# Patient Record
Sex: Male | Born: 1950 | Race: White | Hispanic: No | Marital: Single | Smoking: Never smoker
Health system: Southern US, Community
[De-identification: ages and names within clinical notes are randomized; demographics above are authoritative.]

## PROBLEM LIST (undated history)

## (undated) DIAGNOSIS — G4733 Obstructive sleep apnea (adult) (pediatric): Secondary | ICD-10-CM

## (undated) DIAGNOSIS — I10 Essential (primary) hypertension: Secondary | ICD-10-CM

## (undated) DIAGNOSIS — E119 Type 2 diabetes mellitus without complications: Secondary | ICD-10-CM

## (undated) DIAGNOSIS — R011 Cardiac murmur, unspecified: Secondary | ICD-10-CM

## (undated) DIAGNOSIS — M1990 Unspecified osteoarthritis, unspecified site: Secondary | ICD-10-CM

## (undated) DIAGNOSIS — G4730 Sleep apnea, unspecified: Secondary | ICD-10-CM

## (undated) DIAGNOSIS — E785 Hyperlipidemia, unspecified: Secondary | ICD-10-CM

## (undated) DIAGNOSIS — K219 Gastro-esophageal reflux disease without esophagitis: Secondary | ICD-10-CM

## (undated) HISTORY — DX: Sleep apnea, unspecified: G47.30

## (undated) HISTORY — DX: Cardiac murmur, unspecified: R01.1

## (undated) HISTORY — DX: Unspecified osteoarthritis, unspecified site: M19.90

## (undated) HISTORY — PX: NECK SURGERY: SHX720

## (undated) HISTORY — DX: Obstructive sleep apnea (adult) (pediatric): G47.33

## (undated) HISTORY — DX: Hyperlipidemia, unspecified: E78.5

## (undated) HISTORY — DX: Essential (primary) hypertension: I10

## (undated) HISTORY — DX: Gastro-esophageal reflux disease without esophagitis: K21.9

## (undated) HISTORY — DX: Type 2 diabetes mellitus without complications: E11.9

---

## 2016-09-11 DIAGNOSIS — M170 Bilateral primary osteoarthritis of knee: Secondary | ICD-10-CM | POA: Insufficient documentation

## 2019-07-28 ENCOUNTER — Encounter: Payer: Self-pay | Admitting: Family Medicine

## 2019-11-07 ENCOUNTER — Encounter: Payer: Self-pay | Admitting: Family Medicine

## 2020-01-30 NOTE — Progress Notes (Signed)
Danny Jaksch, DO Reason for referral-abnormal electrocardiogram  HPI: 69 year old male for evaluation of abnormal electrocardiogram at request of Doreatha Lew, DO.  Laboratories April 2021 showed BUN 16, creatinine 0.97, sodium 141, potassium 4.4, normal liver functions, hemoglobin 14.7, hemoglobin A1c 9.3, LDL 93.  Patient describes increased fatigue for approximately 2 years.  He has dyspnea with more vigorous activities but not routine activities.  He denies orthopnea, PND or pedal edema.  He denies chest pain.  Cardiology now asked to evaluate.  Current Outpatient Medications  Medication Sig Dispense Refill  . atorvastatin (LIPITOR) 80 MG tablet Take 80 mg by mouth daily.    Marland Kitchen buPROPion (WELLBUTRIN SR) 100 MG 12 hr tablet Take 100 mg by mouth 2 (two) times daily.    . chlorthalidone (HYGROTON) 25 MG tablet Take 50 mg by mouth daily.    . fenofibrate 160 MG tablet     . glipiZIDE (GLUCOTROL XL) 10 MG 24 hr tablet Take 10 mg by mouth 2 (two) times daily.    Marland Kitchen lisinopril (ZESTRIL) 40 MG tablet Take 40 mg by mouth daily.    . meloxicam (MOBIC) 15 MG tablet Take 15 mg by mouth daily.    . metFORMIN (GLUCOPHAGE) 1000 MG tablet Take 1,000 mg by mouth 2 (two) times daily.    . prazosin (MINIPRESS) 1 MG capsule Take 1 mg by mouth 2 (two) times daily.    Nelva Nay SOLOSTAR 300 UNIT/ML Solostar Pen     . traMADol (ULTRAM) 50 MG tablet Take 50 mg by mouth 2 (two) times daily as needed.     No current facility-administered medications for this visit.    Allergies  Allergen Reactions  . Penicillins     When he was a child     Past Medical History:  Diagnosis Date  . Diabetes mellitus ([place])   . Gastroesophageal reflux disease   . Hyperlipidemia   . Hypertension   . Obstructive sleep apnea     Past Surgical History:  Procedure Laterality Date  . NECK SURGERY      Social History   Socioeconomic History  . Marital status: Single    Spouse name: Not on file  . Number  of children: Not on file  . Years of education: Not on file  . Highest education level: Not on file  Occupational History  . Not on file  Tobacco Use  . Smoking status: Never Smoker  . Smokeless tobacco: Never Used  Substance and Sexual Activity  . Alcohol use: Yes  . Drug use: Not on file  . Sexual activity: Not on file  Other Topics Concern  . Not on file  Social History Narrative  . Not on file   Social Determinants of Health   Financial Resource Strain:   . Difficulty of Paying Living Expenses:   Food Insecurity:   . Worried About Charity fundraiser in the Last Year:   . Arboriculturist in the Last Year:   Transportation Needs:   . Film/video editor (Medical):   Marland Kitchen Lack of Transportation (Non-Medical):   Physical Activity:   . Days of Exercise per Week:   . Minutes of Exercise per Session:   Stress:   . Feeling of Stress :   Social Connections:   . Frequency of Communication with Friends and Family:   . Frequency of Social Gatherings with Friends and Family:   . Attends Religious Services:   . Active Member of Clubs or  Organizations:   . Attends Banker Meetings:   Marland Kitchen Marital Status:   Intimate Partner Violence:   . Fear of Current or Ex-Partner:   . Emotionally Abused:   Marland Kitchen Physically Abused:   . Sexually Abused:     Family History  Problem Relation Age of Onset  . Coronary artery disease Mother   . Coronary artery disease Sister     ROS: Knee arthralgias and fatigue but no fevers or chills, productive cough, hemoptysis, dysphasia, odynophagia, melena, hematochezia, dysuria, hematuria, rash, seizure activity, orthopnea, PND, pedal edema, claudication. Remaining systems are negative.  Physical Exam:   Blood pressure 130/82, pulse 79, height 6\' 1"  (1.854 m), weight 296 lb 1.9 oz (134.3 kg), SpO2 97 %.  General:  Well developed/well nourished in NAD Skin warm/dry Patient not depressed No peripheral  clubbing Back-normal HEENT-normal/normal eyelids Neck supple/normal carotid upstroke bilaterally; no bruits; no JVD; no thyromegaly chest - CTA/ normal expansion CV - RRR/normal S1 and S2; no rubs or gallops;  PMI nondisplaced; 2/6 systolic murmur left sternal border. Abdomen -NT/ND, no HSM, no mass, + bowel sounds, no bruit 2+ femoral pulses, no bruits Ext-no edema, chords, 2+ DP Neuro-grossly nonfocal  ECG -January 18, 2020-sinus rhythm, left axis deviation, right bundle branch block, cannot rule out inferior infarct.  Personally reviewed  A/P  1 abnormal electrocardiogram-electrocardiogram shows possible prior inferior infarct.  Patient also with multiple risk factors including diabetes mellitus.  He does have dyspnea on exertion.  I will arrange a cardiac CTA to exclude significant coronary disease.  We will also arrange an echocardiogram to assess wall motion and LV function.  2 murmur-probable aortic sclerosis.  Echocardiogram to further assess.  3 hypertension-blood pressure controlled.  Continue present medications.  4 hyperlipidemia-continue statin.  5 diabetes mellitus-Per primary care.  January 20, 2020, MD

## 2020-02-01 ENCOUNTER — Other Ambulatory Visit: Payer: Self-pay

## 2020-02-01 ENCOUNTER — Encounter: Payer: Self-pay | Admitting: Cardiology

## 2020-02-01 ENCOUNTER — Ambulatory Visit (INDEPENDENT_AMBULATORY_CARE_PROVIDER_SITE_OTHER): Payer: Medicare Other | Admitting: Cardiology

## 2020-02-01 VITALS — BP 130/82 | HR 79 | Ht 73.0 in | Wt 296.1 lb

## 2020-02-01 DIAGNOSIS — R0600 Dyspnea, unspecified: Secondary | ICD-10-CM | POA: Diagnosis not present

## 2020-02-01 DIAGNOSIS — R9431 Abnormal electrocardiogram [ECG] [EKG]: Secondary | ICD-10-CM

## 2020-02-01 DIAGNOSIS — I10 Essential (primary) hypertension: Secondary | ICD-10-CM | POA: Diagnosis not present

## 2020-02-01 DIAGNOSIS — R0609 Other forms of dyspnea: Secondary | ICD-10-CM

## 2020-02-01 DIAGNOSIS — E7800 Pure hypercholesterolemia, unspecified: Secondary | ICD-10-CM | POA: Diagnosis not present

## 2020-02-01 MED ORDER — METOPROLOL TARTRATE 100 MG PO TABS
ORAL_TABLET | ORAL | 0 refills | Status: DC
Start: 2020-02-01 — End: 2020-04-18

## 2020-02-01 NOTE — Patient Instructions (Signed)
Medication Instructions:  NO CHANGE *If you need a refill on your cardiac medications before your next appointment, please call your pharmacy*   Lab Work: If you have labs (blood work) drawn today and your tests are completely normal, you will receive your results only by: Marland Kitchen MyChart Message (if you have MyChart) OR . A paper copy in the mail If you have any lab test that is abnormal or we need to change your treatment, we will call you to review the results.   Testing/Procedures: Your physician has requested that you have an echocardiogram. Echocardiography is a painless test that uses sound waves to create images of your heart. It provides your doctor with information about the size and shape of your heart and how well your heart's chambers and valves are working. This procedure takes approximately one hour. There are no restrictions for this procedure.[place] [facility]-1ST FLOOR IMAGING  Your cardiac CT will be scheduled at one of the below locations:   [facility] [address] [place], [place] [address] (336) [place] [address] [place], [place] [address] [phone]  If scheduled at [facility], please arrive at the [facility] main entrance of [facility] 30 minutes prior to test start time. Proceed to the [facility] Radiology Department (first floor) to check-in and test prep.  If scheduled at [facility], please arrive 15 mins early for check-in and test prep.  Please follow these instructions carefully (unless otherwise directed):  Hold all erectile dysfunction medications at least 3 days (72 hrs) prior to test.  On the Night Before the Test: . Be sure to Drink plenty of water. . Do not consume any caffeinated/decaffeinated beverages or chocolate 12 hours prior to your test. . Do not take any antihistamines 12 hours prior to your test. . If you  take Metformin do not take 24 hours prior to test.  On the Day of the Test: . Drink plenty of water. Do not drink any water within one hour of the test. . Do not eat any food 4 hours prior to the test. . You may take your regular medications prior to the test.  . Take metoprolol (Lopressor) 100 MG two hours prior to test. . HOLD Furosemide/Hydrochlorothiazide morning of the test. . FEMALES- please wear underwire-free bra if available       After the Test: . Drink plenty of water. . After receiving IV contrast, you may experience a mild flushed feeling. This is normal. . On occasion, you may experience a mild rash up to 24 hours after the test. This is not dangerous. If this occurs, you can take Benadryl 25 mg and increase your fluid intake. . If you experience trouble breathing, this can be serious. If it is severe call 911 IMMEDIATELY. If it is mild, please call our office. . If you take any of these medications: Glipizide/Metformin, Avandament, Glucavance, please do not take 48 hours after completing test unless otherwise instructed.   Once we have confirmed authorization from your insurance company, we will call you to set up a date and time for your test.   For non-scheduling related questions, please contact the cardiac imaging nurse navigator should you have any questions/concerns: Marchia Bond, RN Navigator Cardiac Imaging [facility] [phone] office  For scheduling needs, including cancellations and rescheduling, please call [phone].        Follow-Up: At [facility],  you and your health needs are our priority.  As part of our continuing mission to provide you with exceptional heart care, we have created designated Provider Care Teams.  These Care Teams include your primary Cardiologist (physician) and Advanced Practice Providers (APPs -  Physician Assistants and Nurse Practitioners) who all work together to provide you with the care you  need, when you need it.  We recommend signing up for the patient portal called "MyChart".  Sign up information is provided on this After Visit Summary.  MyChart is used to connect with patients for Virtual Visits (Telemedicine).  Patients are able to view lab/test results, encounter notes, upcoming appointments, etc.  Non-urgent messages can be sent to your provider as well.   To learn more about what you can do with MyChart, go to [email or web address].    Your next appointment:   3 month(s)  The format for your next appointment:   In Person  Provider:   Olga Millers, MD

## 2020-02-07 ENCOUNTER — Other Ambulatory Visit: Payer: Self-pay

## 2020-02-07 ENCOUNTER — Ambulatory Visit (HOSPITAL_BASED_OUTPATIENT_CLINIC_OR_DEPARTMENT_OTHER)
Admission: RE | Admit: 2020-02-07 | Discharge: 2020-02-07 | Disposition: A | Discharge status: Home / Self Care | Payer: Medicare Other | Source: Ambulatory Visit | Attending: Cardiology | Admitting: Cardiology

## 2020-02-07 DIAGNOSIS — R0600 Dyspnea, unspecified: Secondary | ICD-10-CM | POA: Diagnosis present

## 2020-02-07 DIAGNOSIS — R9431 Abnormal electrocardiogram [ECG] [EKG]: Secondary | ICD-10-CM | POA: Diagnosis present

## 2020-02-07 DIAGNOSIS — R0609 Other forms of dyspnea: Secondary | ICD-10-CM

## 2020-02-07 NOTE — Progress Notes (Signed)
  Echocardiogram 2D Echocardiogram has been performed.  Prentiss Bells Mechille Varghese 02/07/2020, 10:40 AM

## 2020-02-17 ENCOUNTER — Other Ambulatory Visit: Payer: Self-pay | Admitting: *Deleted

## 2020-02-17 DIAGNOSIS — R0609 Other forms of dyspnea: Secondary | ICD-10-CM

## 2020-02-20 ENCOUNTER — Telehealth (HOSPITAL_COMMUNITY): Payer: Self-pay | Admitting: *Deleted

## 2020-02-20 NOTE — Telephone Encounter (Signed)
Attempted to call patient regarding upcoming cardiac CT appointment. Left message on voicemail with name and callback number  Merle Tai RN Navigator Cardiac Imaging [place] [facility] [phone] Office [phone] Cell  

## 2020-02-20 NOTE — Telephone Encounter (Signed)
Pt returning call concerning CT scan.  Instructions regarding cardiac CT review and pt expressed understanding of instructions.

## 2020-02-21 ENCOUNTER — Ambulatory Visit (HOSPITAL_COMMUNITY): Admission: RE | Admit: 2020-02-21 | Payer: Medicare Other | Source: Ambulatory Visit

## 2020-02-21 ENCOUNTER — Telehealth: Payer: Self-pay | Admitting: *Deleted

## 2020-02-21 DIAGNOSIS — N289 Disorder of kidney and ureter, unspecified: Secondary | ICD-10-CM

## 2020-02-21 LAB — BASIC METABOLIC PANEL
BUN/Creatinine Ratio: 26 — ABNORMAL HIGH (ref 10–24)
BUN: 53 mg/dL — ABNORMAL HIGH (ref 8–27)
CO2: 16 mmol/L — ABNORMAL LOW (ref 20–29)
Calcium: 10.6 mg/dL — ABNORMAL HIGH (ref 8.6–10.2)
Chloride: 107 mmol/L — ABNORMAL HIGH (ref 96–106)
Creatinine, Ser: 2.05 mg/dL — ABNORMAL HIGH (ref 0.76–1.27)
GFR calc Af Amer: 38 mL/min/{1.73_m2} — ABNORMAL LOW (ref >59)
GFR calc non Af Amer: 33 mL/min/{1.73_m2} — ABNORMAL LOW (ref >59)
Glucose: 156 mg/dL — ABNORMAL HIGH (ref 65–99)
Potassium: 4.9 mmol/L (ref 3.5–5.2)
Sodium: 142 mmol/L (ref 134–144)

## 2020-02-21 NOTE — Telephone Encounter (Signed)
Spoke with pt, labs for CTA reviewed by dr Jens Som, CTA cancelled for today. Patient told to hol chlorthalidone and lisinopril. He will have repeat lab work drawn on Friday this week and based on those results we will make adjustments on medications and get the CTA rescheduled. Danny Montoya aware to cancel CTA for today.

## 2020-02-25 LAB — BASIC METABOLIC PANEL
BUN/Creatinine Ratio: 22 (ref 10–24)
BUN: 29 mg/dL — ABNORMAL HIGH (ref 8–27)
CO2: 20 mmol/L (ref 20–29)
Calcium: 10.7 mg/dL — ABNORMAL HIGH (ref 8.6–10.2)
Chloride: 104 mmol/L (ref 96–106)
Creatinine, Ser: 1.3 mg/dL — ABNORMAL HIGH (ref 0.76–1.27)
GFR calc Af Amer: 66 mL/min/{1.73_m2} (ref >59)
GFR calc non Af Amer: 57 mL/min/{1.73_m2} — ABNORMAL LOW (ref >59)
Glucose: 198 mg/dL — ABNORMAL HIGH (ref 65–99)
Potassium: 4.6 mmol/L (ref 3.5–5.2)
Sodium: 138 mmol/L (ref 134–144)

## 2020-02-27 ENCOUNTER — Telehealth: Payer: Self-pay | Admitting: *Deleted

## 2020-02-27 DIAGNOSIS — N289 Disorder of kidney and ureter, unspecified: Secondary | ICD-10-CM

## 2020-02-27 NOTE — Telephone Encounter (Signed)
Spoke with pt, Aware of dr crenshaw's recommendations.  Lab orders mailed to the pt  

## 2020-02-27 NOTE — Telephone Encounter (Addendum)
-----   Message from Lewayne Bunting, MD sent at 02/25/2020  7:22 AM EDT ----- Renal function improving; stay off chlorthalidone and resume lisinopril at previous dose; bmet one week later. Olga Millers  Left message for pt to call

## 2020-03-13 ENCOUNTER — Encounter: Payer: Self-pay | Admitting: *Deleted

## 2020-03-13 LAB — BASIC METABOLIC PANEL
BUN/Creatinine Ratio: 23 (ref 10–24)
BUN: 24 mg/dL (ref 8–27)
CO2: 19 mmol/L — ABNORMAL LOW (ref 20–29)
Calcium: 9.9 mg/dL (ref 8.6–10.2)
Chloride: 108 mmol/L — ABNORMAL HIGH (ref 96–106)
Creatinine, Ser: 1.05 mg/dL (ref 0.76–1.27)
GFR calc Af Amer: 85 mL/min/{1.73_m2} (ref >59)
GFR calc non Af Amer: 74 mL/min/{1.73_m2} (ref >59)
Glucose: 163 mg/dL — ABNORMAL HIGH (ref 65–99)
Potassium: 4.5 mmol/L (ref 3.5–5.2)
Sodium: 143 mmol/L (ref 134–144)

## 2020-04-12 NOTE — Progress Notes (Signed)
HPI: FU abnormal ECG.  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  Cardiac CTA ordered at last office visit but not performed as BUN/Cr 53/2.05.  His chlorthalidone was discontinued and BUN improved to 24 and creatinine 1.05.  Since last seen he has dyspnea on exertion but no orthopnea, PND, pedal edema, chest pain or syncope.  Current Outpatient Medications  Medication Sig Dispense Refill   atorvastatin (LIPITOR) 80 MG tablet Take 80 mg by mouth daily.     buPROPion (WELLBUTRIN SR) 100 MG 12 hr tablet Take 100 mg by mouth 2 (two) times daily.     fenofibrate 160 MG tablet      glipiZIDE (GLUCOTROL XL) 10 MG 24 hr tablet Take 10 mg by mouth 2 (two) times daily.     lisinopril (ZESTRIL) 40 MG tablet Take 40 mg by mouth daily.     meloxicam (MOBIC) 15 MG tablet Take 15 mg by mouth daily.     metFORMIN (GLUCOPHAGE) 1000 MG tablet Take 1,000 mg by mouth 2 (two) times daily.     prazosin (MINIPRESS) 1 MG capsule Take 1 mg by mouth 2 (two) times daily.     TOUJEO SOLOSTAR 300 UNIT/ML Solostar Pen      traMADol (ULTRAM) 50 MG tablet Take 50 mg by mouth 2 (two) times daily as needed.     metoprolol tartrate (LOPRESSOR) 100 MG tablet TAKE 2 HOURS PRIOR TO CT SCAN (Patient not taking: Reported on 04/18/2020) 1 tablet 0   No current facility-administered medications for this visit.     Past Medical History:  Diagnosis Date   Diabetes mellitus ([facility])    Gastroesophageal reflux disease    Hyperlipidemia    Hypertension    Obstructive sleep apnea     Past Surgical History:  Procedure Laterality Date   NECK SURGERY      Social History   Socioeconomic History   Marital status: Single    Spouse name: Not on file   Number of children: Not on file   Years of education: Not on file   Highest  education level: Not on file  Occupational History   Not on file  Tobacco Use   Smoking status: Never Smoker   Smokeless tobacco: Never Used  Substance and Sexual Activity   Alcohol use: Yes   Drug use: Not on file   Sexual activity: Not on file  Other Topics Concern   Not on file  Social History Narrative   Not on file   Social Determinants of Health   Financial Resource Strain:    Difficulty of Paying Living Expenses:   Food Insecurity:    Worried About Running Out of Food in the Last Year:    Barista in the Last Year:   Transportation Needs:    Freight forwarder (Medical):    Lack of Transportation (Non-Medical):   Physical Activity:    Days of Exercise per Week:    Minutes of Exercise per Session:   Stress:    Feeling of Stress :   Social Connections:    Frequency of Communication with Friends and Family:    Frequency of Social Gatherings with Friends and Family:    Attends Religious Services:    Active Member of Clubs or Organizations:    Attends Banker Meetings:  Marital Status:   Intimate Partner Violence:    Fear of Current or Ex-Partner:    Emotionally Abused:    Physically Abused:    Sexually Abused:     Family History  Problem Relation Age of Onset   Coronary artery disease Mother    Coronary artery disease Sister     ROS: Knee arthralgias but no fevers or chills, productive cough, hemoptysis, dysphasia, odynophagia, melena, hematochezia, dysuria, hematuria, rash, seizure activity, orthopnea, PND, pedal edema, claudication. Remaining systems are negative.  Physical Exam: Well-developed obese in no acute distress.  Skin is warm and dry.  HEENT is normal.  Neck is supple.  Chest is clear to auscultation with normal expansion.  Cardiovascular exam is regular rate and rhythm.  Abdominal exam nontender or distended. No masses palpated. Extremities show no edema. neuro grossly intact  A/P  1  abnormal electrocardiogram-previous ECG shows possible prior inferior infarct.  However echocardiogram shows normal LV function with no wall motion abnormalities.  Patient also has dyspnea on exertion, greater than 20 years of diabetes mellitus and also will require knee replacement in the future.  We will reschedule cardiac CTA to further assess. Hold glucophage 48 hours following CTA.  2 hypertension-blood pressure controlled.  Continue present medications.  3 hyperlipidemia-continue statin.  4 diabetes mellitus-follow-up primary care.  5 dilated ascending aorta-this will be reassessed with above CTA.  Olga Millers, MD

## 2020-04-18 ENCOUNTER — Encounter: Payer: Self-pay | Admitting: Cardiology

## 2020-04-18 ENCOUNTER — Other Ambulatory Visit: Payer: Self-pay

## 2020-04-18 ENCOUNTER — Ambulatory Visit: Payer: Medicare Other | Admitting: Cardiology

## 2020-04-18 VITALS — BP 156/82 | HR 93 | Ht 73.0 in | Wt 300.0 lb

## 2020-04-18 DIAGNOSIS — R9431 Abnormal electrocardiogram [ECG] [EKG]: Secondary | ICD-10-CM

## 2020-04-18 DIAGNOSIS — R0609 Other forms of dyspnea: Secondary | ICD-10-CM

## 2020-04-18 DIAGNOSIS — I10 Essential (primary) hypertension: Secondary | ICD-10-CM

## 2020-04-18 DIAGNOSIS — R0600 Dyspnea, unspecified: Secondary | ICD-10-CM

## 2020-04-18 DIAGNOSIS — E7800 Pure hypercholesterolemia, unspecified: Secondary | ICD-10-CM | POA: Diagnosis not present

## 2020-04-18 MED ORDER — METOPROLOL TARTRATE 100 MG PO TABS
ORAL_TABLET | ORAL | 0 refills | Status: DC
Start: 2020-04-18 — End: 2021-12-16

## 2020-04-18 NOTE — Patient Instructions (Signed)
Medication Instructions:  NO CHANGE *If you need a refill on your cardiac medications before your next appointment, please call your pharmacy*   Lab Work: Your physician recommends that you return for lab work 2-3 DAYS PRIOR TO CT SCAN  If you have labs (blood work) drawn today and your tests are completely normal, you will receive your results only by: Marland Kitchen MyChart Message (if you have MyChart) OR . A paper copy in the mail If you have any lab test that is abnormal or we need to change your treatment, we will call you to review the results.   Testing/Procedures: Your cardiac CT will be scheduled at one of the below locations:   [facility] [address] [place], [place] [address] [phone]  If scheduled at [facility], please arrive at the [facility] main entrance of [facility] 30 minutes prior to test start time. Proceed to the [facility] Radiology Department (first floor) to check-in and test prep.  Please follow these instructions carefully (unless otherwise directed):  Hold all erectile dysfunction medications at least 3 days (72 hrs) prior to test.  On the Night Before the Test: . Be sure to Drink plenty of water. . Do not consume any caffeinated/decaffeinated beverages or chocolate 12 hours prior to your test. . Do not take any antihistamines 12 hours prior to your test.  On the Day of the Test: . Drink plenty of water. Do not drink any water within one hour of the test. . Do not eat any food 4 hours prior to the test. . You may take your regular medications prior to the test.  . Take metoprolol (Lopressor) 100 MG two hours prior to test. . HOLD Furosemide/Hydrochlorothiazide morning of the test.      After the Test: . Drink plenty of water. . After receiving IV contrast, you may experience a mild flushed feeling. This is normal. . On occasion, you may experience a mild rash up to 24 hours after the test. This is not dangerous. If  this occurs, you can take Benadryl 25 mg and increase your fluid intake. . If you experience trouble breathing, this can be serious. If it is severe call 911 IMMEDIATELY. If it is mild, please call our office. . If you take any of these medications: Glipizide/Metformin, Avandament, Glucavance, please do not take 48 hours after completing test unless otherwise instructed.   Once we have confirmed authorization from your insurance company, we will call you to set up a date and time for your test. Based on how quickly your insurance processes prior authorizations requests, please allow up to 4 weeks to be contacted for scheduling your Cardiac CT appointment. Be advised that routine Cardiac CT appointments could be scheduled as many as 8 weeks after your provider has ordered it.  For non-scheduling related questions, please contact the cardiac imaging nurse navigator should you have any questions/concerns: Marchia Bond, Cardiac Imaging Nurse Navigator Burley Saver, Interim Cardiac Imaging Nurse [place] [facility] Direct Office Dial: [phone]   For scheduling needs, including cancellations and rescheduling, please call Vivien Rota at [phone], option 3.    Follow-Up: At [facility], you and your health needs are our priority.  As part of our continuing mission to provide you with exceptional heart care, we have created designated Provider Care Teams.  These Care Teams include your primary Cardiologist (physician) and Advanced Practice Providers (APPs -  Physician Assistants and Nurse Practitioners) who all work  together to provide you with the care you need, when you need it.  We recommend signing up for the patient portal called "MyChart".  Sign up information is provided on this After Visit Summary.  MyChart is used to connect with patients for Virtual Visits (Telemedicine).  Patients are able to view lab/test results, encounter notes, upcoming appointments, etc.   Non-urgent messages can be sent to your provider as well.   To learn more about what you can do with MyChart, go to [email or web address].    Your next appointment:   6 month(s)  The format for your next appointment:   In Person  Provider:   Kirk Ruths, MD

## 2020-06-14 DIAGNOSIS — R944 Abnormal results of kidney function studies: Secondary | ICD-10-CM | POA: Insufficient documentation

## 2020-06-21 ENCOUNTER — Telehealth: Payer: Self-pay | Admitting: Cardiology

## 2020-06-21 NOTE — Telephone Encounter (Signed)
Pt called requesting to have cardiac CTA performed at [facility]. Nurse informed pt that from understanding, test has to be performed at [facility] due to Dr. Jens Som being the ordering provider. Pt voiced frustrated and state it has been forever since MD ordered test and still hasn't been schedule. Pt state at this point he would prefer to have it done at [facility] and requesting changing of orders.

## 2020-06-21 NOTE — Telephone Encounter (Signed)
Makel is calling requesting Dr. Jens Som send over his request for him to have a CT to [facility] so he can have it performed there instead. The phone number to the office is [phone] and the fax # is [phone].

## 2020-06-25 NOTE — Telephone Encounter (Signed)
According to pre-cert there is no authorization needed for cardiac CTA. Required paperwork faxed to the number provided.

## 2020-06-25 NOTE — Telephone Encounter (Signed)
Left message for pt to call. An order with demographics and prior authorization for the cardiac CTA can be faxed to [facility]ology team at [phone]. Waiting for the authorization to be able to fax requested info. Message sent to pre-cert to get authorization for testing.

## 2020-06-27 NOTE — Telephone Encounter (Signed)
Spoke with pt, aware paperwork for scheduling cardiac CTA faxed to [facility].

## 2020-06-29 NOTE — Telephone Encounter (Signed)
Pt called and said [facility] did not receive any information from our office for the patient's upcoming CT. The patient does not have the office fax number any longer, all he has is the phone number [phone]). After contacting the office the fax number is [phone]

## 2020-06-29 NOTE — Telephone Encounter (Signed)
Message routed to Debra RN 

## 2020-06-29 NOTE — Telephone Encounter (Signed)
Spoke with pt, aware paperwork has been received by the [facility] radiology because they have faxed me requesting additional information. Aware requested info faxed to [phone].

## 2020-07-20 ENCOUNTER — Telehealth: Payer: Self-pay | Admitting: *Deleted

## 2020-07-20 MED ORDER — ASPIRIN EC 81 MG PO TBEC: 81.0000 mg | DELAYED_RELEASE_TABLET | Freq: Every day | ORAL | 3 refills | Status: AC

## 2020-07-20 NOTE — Telephone Encounter (Signed)
Left message for pt to call, cardiac CTA done at [facility] results reviewed by dr Jens Som. Patient ca score is 335.6, mild nonobstructive coronary artery disease.dr Jens Som wants the patient to continue on his statin and add aspirin 81 mg once daily. 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

## 2020-07-20 NOTE — Telephone Encounter (Signed)
Spoke with pt, Aware of dr crenshaw's recommendations.  °

## 2020-07-24 DIAGNOSIS — H903 Sensorineural hearing loss, bilateral: Secondary | ICD-10-CM | POA: Insufficient documentation

## 2021-01-22 NOTE — Progress Notes (Deleted)
HPI: FU abnormal ECG.  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  Cardiac CTA ordered at previous office visit but not performed as BUN/Cr 53/2.05.  His chlorthalidone was discontinued and BUN improved to 24 and creatinine 1.05.  Since last seen   Current Outpatient Medications  Medication Sig Dispense Refill  . aspirin EC 81 MG tablet Take 1 tablet (81 mg total) by mouth daily. Swallow whole. 90 tablet 3  . atorvastatin (LIPITOR) 80 MG tablet Take 80 mg by mouth daily.    Marland Kitchen buPROPion (WELLBUTRIN SR) 100 MG 12 hr tablet Take 100 mg by mouth 2 (two) times daily.    . fenofibrate 160 MG tablet     . glipiZIDE (GLUCOTROL XL) 10 MG 24 hr tablet Take 10 mg by mouth 2 (two) times daily.    Marland Kitchen lisinopril (ZESTRIL) 40 MG tablet Take 40 mg by mouth daily.    . meloxicam (MOBIC) 15 MG tablet Take 15 mg by mouth daily.    . metFORMIN (GLUCOPHAGE) 1000 MG tablet Take 1,000 mg by mouth 2 (two) times daily.    . metoprolol tartrate (LOPRESSOR) 100 MG tablet TAKE 2 HOURS PRIOR TO CT SCAN 1 tablet 0  . prazosin (MINIPRESS) 1 MG capsule Take 1 mg by mouth 2 (two) times daily.    Nathen May SOLOSTAR 300 UNIT/ML Solostar Pen     . traMADol (ULTRAM) 50 MG tablet Take 50 mg by mouth 2 (two) times daily as needed.     No current facility-administered medications for this visit.     Past Medical History:  Diagnosis Date  . Diabetes mellitus (HCC)   . Gastroesophageal reflux disease   . Hyperlipidemia   . Hypertension   . Obstructive sleep apnea     Past Surgical History:  Procedure Laterality Date  . NECK SURGERY      Social History   Socioeconomic History  . Marital status: Single    Spouse name: Not on file  . Number of children: Not on file  . Years of education: Not on file  . Highest education level: Not on  file  Occupational History  . Not on file  Tobacco Use  . Smoking status: Never Smoker  . Smokeless tobacco: Never Used  Substance and Sexual Activity  . Alcohol use: Yes  . Drug use: Not on file  . Sexual activity: Not on file  Other Topics Concern  . Not on file  Social History Narrative  . Not on file   Social Determinants of Health   Financial Resource Strain: Not on file  Food Insecurity: Not on file  Transportation Needs: Not on file  Physical Activity: Not on file  Stress: Not on file  Social Connections: Not on file  Intimate Partner Violence: Not on file    Family History  Problem Relation Age of Onset  . Coronary artery disease Mother   . Coronary artery disease Sister     ROS: no fevers or chills, productive cough, hemoptysis, dysphasia, odynophagia, melena, hematochezia, dysuria, hematuria, rash, seizure activity, orthopnea, PND, pedal edema, claudication. Remaining systems are negative.  Physical Exam: Well-developed well-nourished in no acute distress.  Skin is warm and dry.  HEENT is normal.  Neck is supple.  Chest is clear to auscultation with normal expansion.  Cardiovascular exam is regular rate and rhythm.  Abdominal exam nontender or distended. No masses palpated. Extremities show no edema. neuro grossly intact  ECG- personally reviewed  A/P  1 abnormal ECG-previous ECG suggested possible prior inferior infarct but follow-up echocardiogram showed normal LV function without wall motion abnormalities.  Will not pursue further evaluation.  2 dyspnea on exertion-rescheduled cardiac CTA at last office visit to screen for coronary disease as he has greater than 20 years of diabetes mellitus.  3 history of dilated ascending aorta-this will be reassessed with follow-up CTA.  4 hyperlipidemia-continue statin.  5 hypertension-blood pressure controlled.  Continue present medications.  Olga Millers, MD

## 2021-01-30 ENCOUNTER — Ambulatory Visit: Payer: Medicare Other | Admitting: Cardiology

## 2021-07-03 ENCOUNTER — Encounter: Payer: Self-pay | Admitting: *Deleted

## 2021-07-30 ENCOUNTER — Telehealth: Payer: Self-pay | Admitting: *Deleted

## 2021-07-30 NOTE — Telephone Encounter (Signed)
Left message for pt to call, he is due for CTA of the chest in November to follow up on the size of his aorta and he is due to see dr Jens Som. The CTA and office visit can be done in [place].

## 2021-11-19 ENCOUNTER — Telehealth: Payer: Self-pay | Admitting: Cardiology

## 2021-11-19 NOTE — Telephone Encounter (Signed)
Spoke with pt, Aware of dr crenshaw's recommendations.  °

## 2021-11-19 NOTE — Telephone Encounter (Signed)
Patient is calling and said that Dr. Jens Som wanted him to get CT scans done. CT scans are in the requests but they are canceled. Please resend orders for CT to be done

## 2021-11-19 NOTE — Telephone Encounter (Signed)
Forward  to Dr Ludwig Clarks nurse

## 2021-11-19 NOTE — Telephone Encounter (Signed)
Spoke with pt, aware the cardiac CT orders were cancelled because they were never scheduled. He also needs at CTA to measure the size of the aorta. Follow up scheduled as he is overdue to be seen. Will forward to dr Jens Som to see if he wants cardiac CT or CTA of the chest.

## 2021-12-04 NOTE — Progress Notes (Signed)
? ? ? ? ?HPI:FU TAA and CAD.  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  Cardiac CTA performed at [facility] October 2021 showed mild nonobstructive coronary disease and calcium score 335 which was 71st percentile.  There was note of ascending aorta measuring 44 mm.  Abdominal CT at [facility] March 2022 showed no aneurysm.  Since last seen patient has dyspnea with more moderate activities but not routine activities.  No orthopnea, PND, pedal edema, exertional chest pain or syncope. ? ?Current Outpatient Medications  ?Medication Sig Dispense Refill  ? aspirin EC 81 MG tablet Take 1 tablet (81 mg total) by mouth daily. Swallow whole. 90 tablet 3  ? atorvastatin (LIPITOR) 80 MG tablet Take 80 mg by mouth daily.    ? Continuous Blood Gluc Sensor (FREESTYLE LIBRE 14 DAY SENSOR) MISC by Does not apply route.    ? fenofibrate 160 MG tablet     ? glipiZIDE (GLUCOTROL XL) 10 MG 24 hr tablet Take 10 mg by mouth 2 (two) times daily.    ? HUMALOG KWIKPEN 100 UNIT/ML KwikPen Inject into the skin.    ? lisinopril (ZESTRIL) 40 MG tablet Take 40 mg by mouth daily.    ? meloxicam (MOBIC) 15 MG tablet Take 15 mg by mouth daily.    ? metFORMIN (GLUCOPHAGE) 1000 MG tablet Take 1,000 mg by mouth 2 (two) times daily.    ? prazosin (MINIPRESS) 1 MG capsule Take 1 mg by mouth 2 (two) times daily.    ? TOUJEO SOLOSTAR 300 UNIT/ML Solostar Pen     ? traMADol (ULTRAM) 50 MG tablet Take 50 mg by mouth 2 (two) times daily as needed.    ? buPROPion (WELLBUTRIN SR) 100 MG 12 hr tablet Take 100 mg by mouth 2 (two) times daily.    ? ?No current facility-administered medications for this visit.  ? ? ? ?Past Medical History:  ?Diagnosis Date  ? Diabetes mellitus (HCC)   ? Gastroesophageal reflux disease   ? Hyperlipidemia   ? Hypertension   ? Obstructive sleep apnea    ? ? ?Past Surgical History:  ?Procedure Laterality Date  ? NECK SURGERY    ? ? ?Social History  ? ?Socioeconomic History  ? Marital status: Single  ?  Spouse name: Not on file  ? Number of children: Not on file  ? Years of education: Not on file  ? Highest education level: Not on file  ?Occupational History  ? Not on file  ?Tobacco Use  ? Smoking status: Never  ? Smokeless tobacco: Never  ?Substance and Sexual Activity  ? Alcohol use: Yes  ? Drug use: Not on file  ? Sexual activity: Not on file  ?Other Topics Concern  ? Not on file  ?Social History Narrative  ? Not on file  ? ?Social Determinants of Health  ? ?Financial Resource Strain: Not on file  ?Food Insecurity: Not on file  ?Transportation Needs: Not on file  ?Physical Activity: Not on file  ?Stress: Not on file  ?Social Connections: Not on file  ?Intimate Partner Violence: Not on file  ? ? ?Family History  ?Problem Relation Age of Onset  ? Coronary artery disease Mother   ? Coronary artery disease Sister   ? ? ?ROS: no fevers or chills, productive cough, hemoptysis, dysphasia, odynophagia, melena, hematochezia, dysuria, hematuria, rash, seizure activity, orthopnea,  PND, pedal edema, claudication. Remaining systems are negative. ? ?Physical Exam: ?Well-developed well-nourished in no acute distress.  ?Skin is warm and dry.  ?HEENT is normal.  ?Neck is supple.  ?Chest is clear to auscultation with normal expansion.  ?Cardiovascular exam is regular rate and rhythm.  ?Abdominal exam nontender or distended. No masses palpated. ?Extremities show no edema. ?neuro grossly intact ? ?ECG-normal sinus rhythm at a rate of 84, right bundle branch block, inferior lateral infarct.  Personally reviewed ? ?A/P ? ?1 coronary artery disease-mild on previous CTA.  Continue aspirin and statin. ? ?2 thoracic aortic aneurysm-we will arrange follow-up CTA to reassess. ? ?3 hypertension-patient's blood pressure is elevated; also running high at home.  We will add amlodipine 5 mg  daily and follow. ? ?4 hyperlipidemia-continue statin. ? ?5 abnormal ECG-possible inferior infarct on today's electrocardiogram.  We will arrange echocardiogram to assess wall motion. ? ?Olga Millers, MD ? ? ? ?

## 2021-12-16 ENCOUNTER — Encounter: Payer: Self-pay | Admitting: Cardiology

## 2021-12-16 ENCOUNTER — Ambulatory Visit: Payer: Medicare Other | Admitting: Cardiology

## 2021-12-16 ENCOUNTER — Other Ambulatory Visit: Payer: Self-pay

## 2021-12-16 VITALS — BP 148/86 | HR 84 | Ht 73.0 in | Wt 293.0 lb

## 2021-12-16 DIAGNOSIS — R0609 Other forms of dyspnea: Secondary | ICD-10-CM

## 2021-12-16 DIAGNOSIS — I2510 Atherosclerotic heart disease of native coronary artery without angina pectoris: Secondary | ICD-10-CM

## 2021-12-16 DIAGNOSIS — I7121 Aneurysm of the ascending aorta, without rupture: Secondary | ICD-10-CM

## 2021-12-16 DIAGNOSIS — I10 Essential (primary) hypertension: Secondary | ICD-10-CM | POA: Diagnosis not present

## 2021-12-16 DIAGNOSIS — E7800 Pure hypercholesterolemia, unspecified: Secondary | ICD-10-CM

## 2021-12-16 MED ORDER — AMLODIPINE BESYLATE 5 MG PO TABS: 5.0000 mg | ORAL_TABLET | Freq: Every day | ORAL | 3 refills | Status: DC

## 2021-12-16 NOTE — Patient Instructions (Signed)
Medication Instructions:  ? ?START AMLODIPINE 5 MG ONCE DAILY ? ?*If you need a refill on your cardiac medications before your next appointment, please call your pharmacy* ? ?Testing/Procedures: ? ?Your physician has requested that you have an echocardiogram. Echocardiography is a painless test that uses sound waves to create images of your heart. It provides your doctor with information about the size and shape of your heart and how well your heart?s chambers and valves are working. This procedure takes approximately one hour. There are no restrictions for this procedure. [place] [facility] ? ?CTA OF THE CHEST AT THE [place] [facility]ICE ? ? ?Follow-Up: ?At [facility], you and your health needs are our priority.  As part of our continuing mission to provide you with exceptional heart care, we have created designated Provider Care Teams.  These Care Teams include your primary Cardiologist (physician) and Advanced Practice Providers (APPs -  Physician Assistants and Nurse Practitioners) who all work together to provide you with the care you need, when you need it. ? ?We recommend signing up for the patient portal called "MyChart".  Sign up information is provided on this After Visit Summary.  MyChart is used to connect with patients for Virtual Visits (Telemedicine).  Patients are able to view lab/test results, encounter notes, upcoming appointments, etc.  Non-urgent messages can be sent to your provider as well.   ?To learn more about what you can do with MyChart, go to [email or web address].   ? ?Your next appointment:   ?12 month(s) ? ?The format for your next appointment:   ?In Person ? ?Provider:   ?Olga Millers, MD  ? ? ?

## 2021-12-17 ENCOUNTER — Telehealth: Payer: Self-pay | Admitting: *Deleted

## 2021-12-17 DIAGNOSIS — N289 Disorder of kidney and ureter, unspecified: Secondary | ICD-10-CM

## 2021-12-17 DIAGNOSIS — I712 Thoracic aortic aneurysm, without rupture, unspecified: Secondary | ICD-10-CM | POA: Insufficient documentation

## 2021-12-17 LAB — BASIC METABOLIC PANEL
BUN/Creatinine Ratio: 22 (calc) (ref 6–22)
BUN: 35 mg/dL — ABNORMAL HIGH (ref 7–25)
CO2: 24 mmol/L (ref 20–32)
Calcium: 10.9 mg/dL — ABNORMAL HIGH (ref 8.6–10.3)
Chloride: 104 mmol/L (ref 98–110)
Creat: 1.62 mg/dL — ABNORMAL HIGH (ref 0.70–1.35)
Glucose, Bld: 138 mg/dL (ref 65–139)
Potassium: 4.9 mmol/L (ref 3.5–5.3)
Sodium: 138 mmol/L (ref 135–146)

## 2021-12-17 NOTE — Telephone Encounter (Signed)
-----   Message from Lewayne Bunting, MD sent at 12/17/2021  7:20 AM EDT ----- ?BUN/creatinine increased.  Increase fluid intake.  Repeat Bmet in 2 weeks. ?Olga Millers ? ?

## 2021-12-17 NOTE — Addendum Note (Signed)
Addended by: Dorris Fetch on: 12/17/2021 09:45 AM ? ? Modules accepted: Orders ? ?

## 2021-12-17 NOTE — Telephone Encounter (Signed)
pt aware of results  Lab orders mailed to the pt  

## 2021-12-20 ENCOUNTER — Ambulatory Visit (HOSPITAL_BASED_OUTPATIENT_CLINIC_OR_DEPARTMENT_OTHER): Payer: Medicare Other

## 2021-12-23 ENCOUNTER — Ambulatory Visit (INDEPENDENT_AMBULATORY_CARE_PROVIDER_SITE_OTHER): Payer: Medicare Other

## 2021-12-23 ENCOUNTER — Encounter: Payer: Self-pay | Admitting: *Deleted

## 2021-12-23 ENCOUNTER — Other Ambulatory Visit: Payer: Self-pay

## 2021-12-23 DIAGNOSIS — I7121 Aneurysm of the ascending aorta, without rupture: Secondary | ICD-10-CM

## 2021-12-23 MED ORDER — IOHEXOL 350 MG/ML SOLN
100.0000 mL | Freq: Once | INTRAVENOUS | Status: AC | PRN
  Administered 2021-12-23: 80 mL via INTRAVENOUS

## 2021-12-23 MED ORDER — IOHEXOL 350 MG/ML SOLN: 80.0000 mL | Freq: Once | INTRAVENOUS | Status: DC | PRN

## 2022-01-01 ENCOUNTER — Ambulatory Visit (HOSPITAL_BASED_OUTPATIENT_CLINIC_OR_DEPARTMENT_OTHER)
Admission: RE | Admit: 2022-01-01 | Discharge: 2022-01-01 | Disposition: A | Discharge status: Home / Self Care | Payer: Medicare Other | Source: Ambulatory Visit | Attending: Cardiology | Admitting: Cardiology

## 2022-01-01 ENCOUNTER — Other Ambulatory Visit: Payer: Self-pay

## 2022-01-01 DIAGNOSIS — R9431 Abnormal electrocardiogram [ECG] [EKG]: Secondary | ICD-10-CM

## 2022-01-01 DIAGNOSIS — R0609 Other forms of dyspnea: Secondary | ICD-10-CM | POA: Diagnosis not present

## 2022-01-01 LAB — ECHOCARDIOGRAM COMPLETE
AV Mean grad: 9 mmHg
AV Peak grad: 16.2 mmHg
Ao pk vel: 2.01 m/s
Area-P 1/2: 3.33 cm2
S' Lateral: 3.4 cm

## 2022-01-01 NOTE — Progress Notes (Signed)
?  Echocardiogram ?2D Echocardiogram has been performed. ? ?Danny Montoya ?01/01/2022, 11:51 AM ?

## 2022-01-07 ENCOUNTER — Encounter: Payer: Self-pay | Admitting: *Deleted

## 2022-02-26 ENCOUNTER — Ambulatory Visit: Payer: Self-pay

## 2022-02-26 ENCOUNTER — Encounter: Payer: Self-pay | Admitting: Family Medicine

## 2022-02-26 ENCOUNTER — Ambulatory Visit: Payer: Medicare Other | Admitting: Family Medicine

## 2022-02-26 ENCOUNTER — Ambulatory Visit (HOSPITAL_BASED_OUTPATIENT_CLINIC_OR_DEPARTMENT_OTHER)
Admission: RE | Admit: 2022-02-26 | Discharge: 2022-02-26 | Disposition: A | Discharge status: Home / Self Care | Payer: Medicare Other | Source: Ambulatory Visit | Attending: Family Medicine | Admitting: Family Medicine

## 2022-02-26 VITALS — BP 168/96 | Ht 73.0 in | Wt 293.0 lb

## 2022-02-26 DIAGNOSIS — M7501 Adhesive capsulitis of right shoulder: Secondary | ICD-10-CM | POA: Diagnosis present

## 2022-02-26 DIAGNOSIS — M19019 Primary osteoarthritis, unspecified shoulder: Secondary | ICD-10-CM | POA: Insufficient documentation

## 2022-02-26 DIAGNOSIS — M19011 Primary osteoarthritis, right shoulder: Secondary | ICD-10-CM | POA: Insufficient documentation

## 2022-02-26 MED ORDER — KETOROLAC TROMETHAMINE 30 MG/ML IJ SOLN
30.0000 mg | Freq: Once | INTRAMUSCULAR | Status: AC
  Administered 2022-02-26: 30 mg via INTRA_ARTICULAR

## 2022-02-26 NOTE — Assessment & Plan Note (Signed)
Acute on chronic in nature.  His symptoms have been present for a few months.  Seems most consistent with a frozen shoulder following his fall as well as him being a diabetic. -Counseled on home exercise therapy and supportive care. -X-ray today. -Injection. -Could consider physical therapy or Hydro dilation

## 2022-02-26 NOTE — Progress Notes (Signed)
  Danny Montoya - 71 y.o. male MRN [id number]  Date of birth: 1951/08/19  SUBJECTIVE:  Including CC & ROS.  No chief complaint on file.   Danny Montoya is a 71 y.o. male that is presenting with acute on chronic right shoulder pain.  He had a fall a few months ago.  He fell onto his right shoulder.  Since that time he has had problems with pain at night as well as lack of range of motion.  He has limited external rotation as well as flexion..    Review of Systems See HPI   HISTORY: Past Medical, Surgical, Social, and Family History Reviewed & Updated per EMR.   Pertinent Historical Findings include:  Past Medical History:  Diagnosis Date   Diabetes mellitus (HCC)    Gastroesophageal reflux disease    Hyperlipidemia    Hypertension    Obstructive sleep apnea     Past Surgical History:  Procedure Laterality Date   NECK SURGERY       PHYSICAL EXAM:  VS: BP (!) 168/96 (BP Location: Left Arm, Patient Position: Sitting)   Ht 6\' 1"  (1.854 m)   Wt 293 lb (132.9 kg)   BMI 38.66 kg/m  Physical Exam Gen: NAD, alert, cooperative with exam, well-appearing MSK:  Neurovascularly intact     Aspiration/Injection Procedure Note Danny Montoya 1950/12/15  Procedure: Injection Indications: Right shoulder pain  Procedure Details Consent: Risks of procedure as well as the alternatives and risks of each were explained to the (patient/caregiver).  Consent for procedure obtained. Time Out: Verified patient identification, verified procedure, site/side was marked, verified correct patient position, special equipment/implants available, medications/allergies/relevent history reviewed, required imaging and test results available.  Performed.  The area was cleaned with iodine and alcohol swabs.    The right glenohumeral joint was injected using 3 cc of 1% lidocaine on a 22-gauge 3-1/2 inch needle.  The syringe was switched and a mixture containing 1 cc's of 30 mg Toradol, 5 cc of normal saline and 4 cc's of 0.5%  bupivacaine was injected.  Ultrasound was used. Images were obtained in short views showing the injection.     A sterile dressing was applied.  Patient did tolerate procedure well.      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  His symptoms have been present for a few months.  Seems most consistent with a frozen shoulder following his fall as well as him being a diabetic. -Counseled on home exercise therapy and supportive care. -X-ray today. -Injection. -Could consider physical therapy or Hydro dilation

## 2022-02-26 NOTE — Patient Instructions (Signed)
Nice to meet you Please try heat before exercise and ice after Please try the exercises   Please send me a message in MyChart with any questions or updates.  Please see me back in 4 weeks.   --Dr. Chayna Surratt  

## 2022-02-27 ENCOUNTER — Telehealth: Payer: Self-pay | Admitting: Family Medicine

## 2022-02-27 NOTE — Telephone Encounter (Signed)
Informed of results.   Myra Rude, MD [facility] 02/27/2022, 12:03 PM

## 2022-03-31 ENCOUNTER — Ambulatory Visit: Payer: Medicare Other | Admitting: Family Medicine

## 2022-03-31 ENCOUNTER — Ambulatory Visit: Payer: Self-pay

## 2022-03-31 VITALS — BP 146/84 | Ht 73.0 in | Wt 290.0 lb

## 2022-03-31 DIAGNOSIS — M7501 Adhesive capsulitis of right shoulder: Secondary | ICD-10-CM

## 2022-03-31 DIAGNOSIS — M1712 Unilateral primary osteoarthritis, left knee: Secondary | ICD-10-CM | POA: Insufficient documentation

## 2022-03-31 MED ORDER — TRIAMCINOLONE ACETONIDE 40 MG/ML IJ SUSP
40.0000 mg | Freq: Once | INTRAMUSCULAR | Status: AC
  Administered 2022-03-31: 40 mg via INTRA_ARTICULAR

## 2022-03-31 NOTE — Assessment & Plan Note (Signed)
Acute on chronic in nature.  Has tried injections as well as nerve ablation with limited improvement. -Counseled on home exercise therapy and supportive care. -Could consider referral for arthroplasty.

## 2022-05-12 ENCOUNTER — Encounter: Payer: Self-pay | Admitting: Family Medicine

## 2022-05-12 ENCOUNTER — Ambulatory Visit: Payer: Medicare Other | Admitting: Family Medicine

## 2022-05-12 VITALS — BP 150/94 | Ht 73.0 in | Wt 290.0 lb

## 2022-05-12 DIAGNOSIS — E1142 Type 2 diabetes mellitus with diabetic polyneuropathy: Secondary | ICD-10-CM | POA: Insufficient documentation

## 2022-05-12 DIAGNOSIS — M19011 Primary osteoarthritis, right shoulder: Secondary | ICD-10-CM

## 2022-05-12 DIAGNOSIS — M1712 Unilateral primary osteoarthritis, left knee: Secondary | ICD-10-CM

## 2022-05-12 NOTE — Assessment & Plan Note (Addendum)
Acute on chronic in nature. Has limited ability to check his feet. No sign of infection or ulcer today.  - referral to podiatry

## 2022-05-12 NOTE — Progress Notes (Addendum)
  Danny Montoya - 71 y.o. male MRN [id number]  Date of birth: [birth year]  SUBJECTIVE:  Including CC & ROS.  No chief complaint on file.   Danny Montoya is a 71 y.o. male that is following up for his shoulder and knee pain.  He has gotten improvement with the most recent steroid injection to the shoulder.  He knee is still bothering him on a regular basis.  He has severe pain and stiffness.  He feels weak in his legs.   Review of Systems See HPI   HISTORY: Past Medical, Surgical, Social, and Family History Reviewed & Updated per EMR.   Pertinent Historical Findings include:  Past Medical History:  Diagnosis Date   Diabetes mellitus (HCC)    Gastroesophageal reflux disease    Hyperlipidemia    Hypertension    Obstructive sleep apnea     Past Surgical History:  Procedure Laterality Date   NECK SURGERY       PHYSICAL EXAM:  VS: BP (!) 150/94 (BP Location: Left Arm, Patient Position: Sitting)   Ht 6\' 1"  (1.854 m)   Wt 290 lb (131.5 kg)   BMI 38.26 kg/m  Physical Exam Gen: NAD, alert, cooperative with exam, well-appearing MSK:  Neurovascularly intact       ASSESSMENT & PLAN:   OA (osteoarthritis) of shoulder Doing well since the injection.  Still has stiffness in the morning. -Counseled on home exercise therapy and supportive care. -Referral to physical therapy. -Could consider further imaging.  Primary osteoarthritis of left knee Acute on chronic in nature.  Symptoms seem to be worse in the morning.  Has weakness in his legs. -Counseled on home exercise therapy and supportive care. -Referral to physical therapy. -Pursue Zilretta injection. -Could consider further imaging.  Diabetic peripheral neuropathy associated with type 2 diabetes mellitus (HCC) Acute on chronic in nature. Has limited ability to check his feet. No sign of infection or ulcer today.  - referral to podiatry

## 2022-05-12 NOTE — Addendum Note (Signed)
Addended by: Myra Rude on: 05/12/2022 12:18 PM   Modules accepted: Orders

## 2022-05-12 NOTE — Patient Instructions (Signed)
Good to see you I have made a referral to physical therapy at [facility].  We will call to check on your benefits for the zilretta  Please send me a message in MyChart with any questions or updates.  Please see me back to have the injection performed.   --Dr. Jordan Likes

## 2022-05-12 NOTE — Assessment & Plan Note (Signed)
Acute on chronic in nature.  Symptoms seem to be worse in the morning.  Has weakness in his legs. -Counseled on home exercise therapy and supportive care. -Referral to physical therapy. -Pursue Zilretta injection. -Could consider further imaging.

## 2022-05-12 NOTE — Assessment & Plan Note (Signed)
Doing well since the injection.  Still has stiffness in the morning. -Counseled on home exercise therapy and supportive care. -Referral to physical therapy. -Could consider further imaging.

## 2022-05-15 ENCOUNTER — Telehealth: Payer: Self-pay | Admitting: Family Medicine

## 2022-05-15 NOTE — Telephone Encounter (Signed)
Submitted request for zilretta today. Uploaded last office note. 

## 2022-05-16 ENCOUNTER — Encounter: Payer: Self-pay | Admitting: Podiatry

## 2022-05-16 ENCOUNTER — Ambulatory Visit: Payer: Medicare Other | Admitting: Podiatry

## 2022-05-16 DIAGNOSIS — E1142 Type 2 diabetes mellitus with diabetic polyneuropathy: Secondary | ICD-10-CM

## 2022-05-16 DIAGNOSIS — M79675 Pain in left toe(s): Secondary | ICD-10-CM | POA: Diagnosis not present

## 2022-05-16 DIAGNOSIS — M79674 Pain in right toe(s): Secondary | ICD-10-CM | POA: Diagnosis not present

## 2022-05-16 DIAGNOSIS — B351 Tinea unguium: Secondary | ICD-10-CM | POA: Diagnosis not present

## 2022-05-16 NOTE — Progress Notes (Signed)
  Subjective:  Patient ID: Danny Montoya, male    DOB: 12-02-1950,   MRN: [id number]  Chief Complaint  Patient presents with   Nail Problem    Diabetic peripheral neuropathy associated with type 2 diabetes     71 y.o. male presents for concern of thickened elongated and painful nails that are difficult to trim. Requesting to have them trimmed today. Relates burning and tingling in their feet. Patient is diabetic and last A1c was No results found for: "HGBA1C" .   PCP:  Si Gaul, DO    . Denies any other pedal complaints. Denies n/v/f/c.   Past Medical History:  Diagnosis Date   Diabetes mellitus (HCC)    Gastroesophageal reflux disease    Hyperlipidemia    Hypertension    Obstructive sleep apnea     Objective:  Physical Exam: Vascular: DP/PT pulses 2/4 bilateral. CFT <3 seconds. Absent hair growth on digits. Edema noted to bilateral lower extremities. Xerosis noted bilaterally.  Skin. No lacerations or abrasions bilateral feet. Nails 1-5 bilateral  are thickened discolored and elongated with subungual debris.  Musculoskeletal: MMT 5/5 bilateral lower extremities in DF, PF, Inversion and Eversion. Deceased ROM in DF of ankle joint.  Neurological: Sensation intact to light touch. Protective sensation diminished bilateral.    Assessment:   1. Diabetic peripheral neuropathy associated with type 2 diabetes mellitus (HCC)   2. Pain due to onychomycosis of toenails of both feet      Plan:  Patient was evaluated and treated and all questions answered. -Discussed and educated patient on diabetic foot care, especially with  regards to the vascular, neurological and musculoskeletal systems.  -Stressed the importance of good glycemic control and the detriment of not  controlling glucose levels in relation to the foot. -Discussed supportive shoes at all times and checking feet regularly.  -Mechanically debrided all nails 1-5 bilateral using sterile nail nipper and filed with dremel  without incident  -Answered all patient questions -Patient to return  in 3 months for at risk foot care -Patient advised to call the office if any problems or questions arise in the meantime.   Louann Sjogren, DPM

## 2022-05-16 NOTE — Telephone Encounter (Signed)
Pt informed of below.   Received benefit summary from [employer]. No PA, precert is required. Patient owes 20% of Zilretta with the remaining 80% covered by plan. Patient has a $20 copay for CPT 20610. Deductibles do not apply.  OV 05/20/22 @ 9:50 am.

## 2022-05-20 ENCOUNTER — Ambulatory Visit: Payer: Medicare Other | Admitting: Family Medicine

## 2022-05-20 ENCOUNTER — Encounter: Payer: Self-pay | Admitting: Family Medicine

## 2022-05-20 ENCOUNTER — Ambulatory Visit: Payer: Self-pay

## 2022-05-20 VITALS — BP 171/91 | Ht 73.0 in | Wt 290.0 lb

## 2022-05-20 DIAGNOSIS — M1712 Unilateral primary osteoarthritis, left knee: Secondary | ICD-10-CM | POA: Diagnosis not present

## 2022-05-20 MED ORDER — TRIAMCINOLONE ACETONIDE 32 MG IX SRER
32.0000 mg | Freq: Once | INTRA_ARTICULAR | Status: AC
  Administered 2022-05-20: 32 mg via INTRA_ARTICULAR

## 2022-05-20 NOTE — Progress Notes (Signed)
  Danny Montoya - 71 y.o. male MRN [id number]  Date of birth: [birth year]  SUBJECTIVE:  Including CC & ROS.  No chief complaint on file.   Danny Montoya is a 71 y.o. male that is  here for zilretta injection.    Review of Systems See HPI   HISTORY: Past Medical, Surgical, Social, and Family History Reviewed & Updated per EMR.   Pertinent Historical Findings include:  Past Medical History:  Diagnosis Date   Diabetes mellitus (HCC)    Gastroesophageal reflux disease    Hyperlipidemia    Hypertension    Obstructive sleep apnea     Past Surgical History:  Procedure Laterality Date   NECK SURGERY       PHYSICAL EXAM:  VS: BP (!) 171/91 (BP Location: Left Arm, Patient Position: Sitting)   Ht 6\' 1"  (1.854 m)   Wt 290 lb (131.5 kg)   BMI 38.26 kg/m  Physical Exam Gen: NAD, alert, cooperative with exam, well-appearing MSK:  Neurovascularly intact     Aspiration/Injection Procedure Note Danny Montoya 19-Aug-1951  Procedure: Aspiration and Injection Indications: left knee pain  Procedure Details Consent: Risks of procedure as well as the alternatives and risks of each were explained to the (patient/caregiver).  Consent for procedure obtained. Time Out: Verified patient identification, verified procedure, site/side was marked, verified correct patient position, special equipment/implants available, medications/allergies/relevent history reviewed, required imaging and test results available.  Performed.  The area was cleaned with iodine and alcohol swabs.    The left knee superior lateral suprapatellar pouch was injected using 3 cc of 1% lidocaine on a 25-gauge 1-1/2 inch needle.  An 18-gauge 1-1/2 needle was used to achieve aspiration.  The syringe was switched and a mixture containing 5 cc's of 32 mg Zilretta and 4 cc's of 0.25% bupivacaine was injected.  Ultrasound was used. Images were obtained in long views showing the injection.   Amount of Fluid Aspirated:  52mL Character of Fluid: clear  and straw colored Fluid was sent for: n/a  A sterile dressing was applied.  Patient did tolerate procedure well.       ASSESSMENT & PLAN:   Primary osteoarthritis of left knee Completed zilretta injection.

## 2022-05-20 NOTE — Assessment & Plan Note (Signed)
Completed zilretta injection.  ?

## 2022-05-20 NOTE — Patient Instructions (Signed)
Good to see you  Please use ice as needed  Please send me a message in MyChart with any questions or updates.  Please see me back in 4 weeks.   --Dr. Kyliyah Stirn  

## 2022-05-21 ENCOUNTER — Ambulatory Visit: Payer: Medicare Other | Attending: Podiatry | Admitting: Physical Therapy

## 2022-05-21 ENCOUNTER — Encounter: Payer: Self-pay | Admitting: Physical Therapy

## 2022-05-21 DIAGNOSIS — M25511 Pain in right shoulder: Secondary | ICD-10-CM | POA: Diagnosis present

## 2022-05-21 DIAGNOSIS — M19011 Primary osteoarthritis, right shoulder: Secondary | ICD-10-CM | POA: Diagnosis not present

## 2022-05-21 DIAGNOSIS — G8929 Other chronic pain: Secondary | ICD-10-CM | POA: Insufficient documentation

## 2022-05-21 DIAGNOSIS — R29898 Other symptoms and signs involving the musculoskeletal system: Secondary | ICD-10-CM | POA: Insufficient documentation

## 2022-05-21 DIAGNOSIS — M25562 Pain in left knee: Secondary | ICD-10-CM | POA: Diagnosis present

## 2022-05-21 DIAGNOSIS — M6281 Muscle weakness (generalized): Secondary | ICD-10-CM | POA: Diagnosis present

## 2022-05-21 DIAGNOSIS — M1712 Unilateral primary osteoarthritis, left knee: Secondary | ICD-10-CM | POA: Insufficient documentation

## 2022-05-21 NOTE — Therapy (Signed)
OUTPATIENT PHYSICAL THERAPY LOWER EXTREMITY EVALUATION   Patient Name: Danny Montoya MRN: [id number] DOB:August 08, 1951, 71 y.o., male Today's Date: 05/21/2022   PT End of Session - 05/21/22 1150     Visit Number 1    Number of Visits 12    Date for PT Re-Evaluation 07/02/22    Authorization Type [employer] medicare    Authorization - Visit Number 1    Progress Note Due on Visit 10    PT Start Time 1100    PT Stop Time 1145    PT Time Calculation (min) 45 min    Activity Tolerance Patient tolerated treatment well    Behavior During Therapy WFL for tasks assessed/performed             Past Medical History:  Diagnosis Date   Diabetes mellitus (HCC)    Gastroesophageal reflux disease    Hyperlipidemia    Hypertension    Obstructive sleep apnea    Past Surgical History:  Procedure Laterality Date   NECK SURGERY     Patient Active Problem List   Diagnosis Date Noted   Diabetic peripheral neuropathy associated with type 2 diabetes mellitus (HCC) 05/12/2022   Primary osteoarthritis of left knee 03/31/2022   OA (osteoarthritis) of shoulder 02/26/2022    REFERRING PROVIDER: Clare Gandy  REFERRING DIAG: OA of Rt shoulder, OA of left knee  THERAPY DIAG:  Chronic right shoulder pain  Chronic pain of left knee  Muscle weakness (generalized)  Other symptoms and signs involving the musculoskeletal system  Rationale for Evaluation and Treatment Rehabilitation  ONSET DATE: 05/12/22  SUBJECTIVE:   SUBJECTIVE STATEMENT: Pt has had chronic Lt knee pain for about 8 years. He states he really needs a knee replacement but lives alone and is worried about managing. He states pain is worse in the mornings and stiffens throughout the day. 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  He also states he has a "frozen shoulder" on Rt side which has been going on for 3-4 months. Pain in Rt shoulder increases with reaching above 90 degrees and with shoulder extension and  IR. Pt had a shot in Rt shoulder which has improved ROM, he also had a shot in Lt knee yesterday.  PERTINENT HISTORY: Diabetes  PAIN:  Are you having pain? Yes: NPRS scale: 2/10 Pain location: Lt knee Pain description: achey, sore Aggravating factors: mornings, prolonged sitting Relieving factors: change positions  PRECAUTIONS: None  WEIGHT BEARING RESTRICTIONS No  FALLS:  Has patient fallen in last 6 months? No  LIVING ENVIRONMENT: Lives with: lives alone Lives in: House/apartment Stairs: Yes: Internal: 7 steps; can reach both and External: 4 steps; can reach both Has following equipment at home: None  OCCUPATION: retired. Works on cars for fun  PLOF: Independent  PATIENT GOALS reduce pain   OBJECTIVE:   DIAGNOSTIC FINDINGS: Rt shoulder x ray shows degenerative changes  PATIENT SURVEYS:  FOTO 52  COGNITION:  Overall cognitive status: Within functional limits for tasks assessed     SENSATION: Diabetic neuropathy in feet/toes   MUSCLE LENGTH: Hamstrings: decreased bilat    PALPATION: Hypomobile inferior, and A/P Rt shoulder mobs. Decreased patellar mobility LT knee  LOWER EXTREMITY ROM:  Active ROM Right eval Left eval  Hip flexion    Hip extension    Hip abduction    Hip adduction    Hip internal rotation    Hip external rotation    Knee flexion 107 86  Knee extension -5 -  5  Ankle dorsiflexion    Ankle plantarflexion    Ankle inversion    Ankle eversion     (Blank rows = not tested) UPPER EXTREMITY ROM:  Active ROM Right eval Left eval  Shoulder flexion  120  Shoulder extension    Shoulder abduction  94  Shoulder adduction    Shoulder extension    Shoulder internal rotation  45  Shoulder external rotation  35  Elbow flexion    Elbow extension    Wrist flexion    Wrist extension    Wrist ulnar deviation    Wrist radial deviation    Wrist pronation    Wrist supination     (Blank rows = not tested)  LOWER EXTREMITY MMT:  MMT  Right eval Left eval  Hip flexion 4+/5 4+/5  Hip extension    Hip abduction    Hip adduction    Hip internal rotation    Hip external rotation    Knee flexion 4/5 4-/5  Knee extension 4+/5 4/5  Ankle dorsiflexion    Ankle plantarflexion    Ankle inversion    Ankle eversion     (Blank rows = not tested)   FUNCTIONAL TESTS:  SLS Lt 1 sec, Rt 3 sec  GAIT: Distance walked: 100 Assistive device utilized: None Level of assistance: Complete Independence Comments: wide BOS, antalgic gait, decreased stance on Lt    TODAY'S TREATMENT: Sit to stand without UE support elevated mat table x 10 Step ups with gentle UE support 4'' step x 10 Lt LE Wall slides for Rt shoulder flexion and abduction 3 x 10 sec each Doorway Rt shoulder ER stretch 2 x 20 sec   PATIENT EDUCATION:  Education details: PT POC and goals, HEP Person educated: Patient Education method: Programmer, multimedia, Facilities manager, and Handouts Education comprehension: verbalized understanding and returned demonstration   HOME EXERCISE PROGRAM: Access Code: [facility] URL: [email or web address] Date: 05/21/2022 Prepared by: Reggy Eye  Exercises - Shoulder Flexion Wall Slide with Towel  - 1 x daily - 7 x weekly - 1 sets - 10 reps - 10 seconds hold - Standing Shoulder Abduction Slides at Wall  - 1 x daily - 7 x weekly - 1 sets - 10 reps - 10 seconds hold - Standing Shoulder External Rotation Stretch in Doorway  - 1 x daily - 7 x weekly - 1 sets - 3 reps - 20-30 seconds hold - Sit to Stand Without Arm Support  - 1 x daily - 7 x weekly - 2 sets - 10 reps - Forward Step Up  - 1 x daily - 7 x weekly - 2 sets - 10 reps  ASSESSMENT:  CLINICAL IMPRESSION: Patient is a 71 y.o. male who was seen today for physical therapy evaluation and treatment for Lt knee pain and Rt shoulder pain. He presents with increased pain, decreased strength and ROM, impaired gait, decreased activity tolerance, impaired balance,  decreased functional mobility and will benefit from skilled PT to address deficits and improve functional mobility    OBJECTIVE IMPAIRMENTS decreased activity tolerance, decreased balance, decreased endurance, decreased mobility, difficulty walking, decreased ROM, decreased strength, hypomobility, impaired flexibility, and pain.   ACTIVITY LIMITATIONS lifting, bending, standing, squatting, stairs, and transfers  PARTICIPATION LIMITATIONS: cleaning, laundry, and community activity  PERSONAL FACTORS Behavior pattern, Fitness, and Past/current experiences are also affecting patient's functional outcome.   REHAB POTENTIAL: Good  CLINICAL DECISION MAKING: Evolving/moderate complexity  EVALUATION COMPLEXITY: Moderate   GOALS: Goals reviewed with patient? Yes  LONG TERM GOALS: Target date: 07/02/2022   Pt will be independent with HEP Baseline:  Goal status: INITIAL  2.  Pt will improve FOTO to >= 58 to demo improved functional mobility Baseline:  Goal status: INITIAL  3.  Pt will improve Rt shoulder ROM to 160 degrees flexion, 150 degrees abduction and 50 degrees IR and ER to improve ability to perform IADLs Baseline:  Goal status: INITIAL  4.  Pt will improve Lt LE strength to 4+/5 to improve walking and standing tolerance Baseline:  Goal status: INITIAL  5.  Pt will be able to walk x 20 minutes with Lt knee pain <= 2/10 Baseline:  Goal status: INITIAL    PLAN: PT FREQUENCY: 2x/week  PT DURATION: 6 weeks  PLANNED INTERVENTIONS: Therapeutic exercises, Therapeutic activity, Neuromuscular re-education, Balance training, Gait training, Patient/Family education, Self Care, Joint mobilization, Stair training, Aquatic Therapy, Dry Needling, Electrical stimulation, Cryotherapy, Moist heat, Taping, Vasopneumatic device, Ultrasound, Ionotophoresis 4mg /ml Dexamethasone, and Manual therapy  PLAN FOR NEXT SESSION: assess and progress HEP for knee strength and shoulder  ROM   Lataya Varnell, PT 05/21/2022, 11:51 AM

## 2022-05-27 ENCOUNTER — Encounter: Payer: Self-pay | Admitting: Physical Therapy

## 2022-05-27 ENCOUNTER — Ambulatory Visit: Payer: Medicare Other | Admitting: Physical Therapy

## 2022-05-27 DIAGNOSIS — M25511 Pain in right shoulder: Secondary | ICD-10-CM | POA: Diagnosis not present

## 2022-05-27 DIAGNOSIS — R29898 Other symptoms and signs involving the musculoskeletal system: Secondary | ICD-10-CM

## 2022-05-27 DIAGNOSIS — M6281 Muscle weakness (generalized): Secondary | ICD-10-CM

## 2022-05-27 DIAGNOSIS — G8929 Other chronic pain: Secondary | ICD-10-CM

## 2022-05-27 NOTE — Therapy (Addendum)
OUTPATIENT PHYSICAL THERAPY LOWER EXTREMITY DISCHARGE   Patient Name: Danny Montoya MRN: [id number] DOB:1951-05-21, 71 y.o., male Today's Date: 05/27/2022   PT End of Session - 05/27/22 1144     Visit Number 2    Number of Visits 12    Date for PT Re-Evaluation 07/02/22    Authorization Type [employer] medicare    Authorization - Visit Number 2    Progress Note Due on Visit 10    PT Start Time 1100    PT Stop Time 1143    PT Time Calculation (min) 43 min    Activity Tolerance Patient tolerated treatment well    Behavior During Therapy WFL for tasks assessed/performed              Past Medical History:  Diagnosis Date   Diabetes mellitus ([place])    Gastroesophageal reflux disease    Hyperlipidemia    Hypertension    Obstructive sleep apnea    Past Surgical History:  Procedure Laterality Date   NECK SURGERY     Patient Active Problem List   Diagnosis Date Noted   Diabetic peripheral neuropathy associated with type 2 diabetes mellitus ([place]) 05/12/2022   Primary osteoarthritis of left knee 03/31/2022   OA (osteoarthritis) of shoulder 02/26/2022    REFERRING PROVIDER: Clearance Coots  REFERRING DIAG: OA of Rt shoulder, OA of left knee  THERAPY DIAG:  Chronic right shoulder pain  Chronic pain of left knee  Muscle weakness (generalized)  Other symptoms and signs involving the musculoskeletal system  Rationale for Evaluation and Treatment Rehabilitation  ONSET DATE: 05/12/22  SUBJECTIVE:   SUBJECTIVE STATEMENT: Pt states "the shot is definitely helping". He states he still feels like he is limping and still has pain  PERTINENT HISTORY: Diabetes  PAIN:  Are you having pain? Yes: NPRS scale: 2/10 Pain location: Lt knee and Rt shoulder Pain description: achey, sore Aggravating factors: mornings, prolonged sitting Relieving factors: change positions  PRECAUTIONS: None  PATIENT GOALS reduce pain   OBJECTIVE:    LOWER EXTREMITY ROM:  Active ROM Right eval  Left eval  Hip flexion    Hip extension    Hip abduction    Hip adduction    Hip internal rotation    Hip external rotation    Knee flexion 107 86  Knee extension -5 -5  Ankle dorsiflexion    Ankle plantarflexion    Ankle inversion    Ankle eversion     (Blank rows = not tested) UPPER EXTREMITY ROM:  Active ROM Right eval Left eval  Shoulder flexion  120  Shoulder extension    Shoulder abduction  94  Shoulder adduction    Shoulder extension    Shoulder internal rotation  45  Shoulder external rotation  35  Elbow flexion    Elbow extension    Wrist flexion    Wrist extension    Wrist ulnar deviation    Wrist radial deviation    Wrist pronation    Wrist supination     (Blank rows = not tested)  LOWER EXTREMITY MMT:  MMT Right eval Left eval  Hip flexion 4+/5 4+/5  Hip extension    Hip abduction    Hip adduction    Hip internal rotation    Hip external rotation    Knee flexion 4/5 4-/5  Knee extension 4+/5 4/5  Ankle dorsiflexion    Ankle plantarflexion    Ankle inversion    Ankle eversion     (Blank rows =  not tested)   FUNCTIONAL TESTS:  (Eval) SLS Lt 1 sec, Rt 3 sec    TODAY'S TREATMENT: 05/27/22 Nustep L5 x 5 min warm up  Shoulder ER stretch in doorway 2 x 20 sec Wall slides Rt shoulder flexion and then abduction 5 x 10 sec each  Step ups forward and laterally 2 x 10 4'' step without UE support Sidestep red TB 2 x 10 steps Tandem stance 2 x 30 sec SLS with tapping 3 directions intermittent UE support x 10 Sit to stand from elevating mat table - lowering table after every 5 reps LAQ 3# x 10 with 3 sec hold HS curl green TB x 20 Heel raises standing x 20    05/21/22 Sit to stand without UE support elevated mat table x 10 Step ups with gentle UE support 4'' step x 10 Lt LE Wall slides for Rt shoulder flexion and abduction 3 x 10 sec each Doorway Rt shoulder ER stretch 2 x 20 sec   PATIENT EDUCATION:  Education details: PT POC and  goals, HEP Person educated: Patient Education method: Explanation, Demonstration, and Handouts Education comprehension: verbalized understanding and returned demonstration   HOME EXERCISE PROGRAM: Access Code: [facility] URL: [email or web address] Date: 05/27/2022 Prepared by: Reggy Eye  Exercises - Shoulder Flexion Wall Slide with Towel  - 1 x daily - 7 x weekly - 1 sets - 10 reps - 10 seconds hold - Standing Shoulder Abduction Slides at Wall  - 1 x daily - 7 x weekly - 1 sets - 10 reps - 10 seconds hold - Standing Shoulder External Rotation Stretch in Doorway  - 1 x daily - 7 x weekly - 1 sets - 3 reps - 20-30 seconds hold - Sit to Stand Without Arm Support  - 1 x daily - 7 x weekly - 2 sets - 10 reps - Forward Step Up  - 1 x daily - 7 x weekly - 2 sets - 10 reps - Lateral Step Up  - 1 x daily - 7 x weekly - 2 sets - 10 reps - Side Stepping with Resistance at Ankles  - 1 x daily - 7 x weekly - 3 sets - 10 reps - Tandem Stance with Support  - 1 x daily - 7 x weekly - 1 sets - 3 reps - 20-30 sec hold - Seated Long Arc Quad with Ankle Weight  - 1 x daily - 7 x weekly - 2 sets - 10 reps - 3 seconds hold - Standing Heel Raise with Support  - 1 x daily - 7 x weekly - 2 sets - 10 reps  ASSESSMENT:  CLINICAL IMPRESSION: Pt is progressing well with Rt shoulder ROM and LT knee strength. He states he has a high copay so wants to check in in 1 month. Session focused on progressing HEP and education on importance of building strength and maintaining ROM   GOALS: Goals reviewed with patient? Yes    LONG TERM GOALS: Target date: 07/08/2022   Pt will be independent with HEP Baseline:  Goal status: INITIAL  2.  Pt will improve FOTO to >= 58 to demo improved functional mobility Baseline:  Goal status: INITIAL  3.  Pt will improve Rt shoulder ROM to 160 degrees flexion, 150 degrees abduction and 50 degrees IR and ER to improve ability to perform IADLs Baseline:  Goal  status: INITIAL  4.  Pt will improve Lt LE strength to 4+/5 to improve walking and standing tolerance Baseline:  Goal status: INITIAL  5.  Pt will be able to walk x 20 minutes with Lt knee pain <= 2/10 Baseline:  Goal status: INITIAL    PLAN: PT FREQUENCY: 2x/week  PT DURATION: 6 weeks  PLANNED INTERVENTIONS: Therapeutic exercises, Therapeutic activity, Neuromuscular re-education, Balance training, Gait training, Patient/Family education, Self Care, Joint mobilization, Stair training, Aquatic Therapy, Dry Needling, Electrical stimulation, Cryotherapy, Moist heat, Taping, Vasopneumatic device, Ultrasound, Ionotophoresis 4mg /ml Dexamethasone, and Manual therapy  PLAN FOR NEXT SESSION: check progress towards goals PHYSICAL THERAPY DISCHARGE SUMMARY  Visits from Start of Care: 2  Current functional level related to goals / functional outcomes: Improving shoulder ROM and knee strength   Remaining deficits: See above   Education / Equipment: HEP   Patient agrees to discharge. Patient goals were not met. Patient is being discharged due to financial reasons.  Isabelle Course, PT,DPT09/20/239:14 AM  Nicholle Falzon, PT 05/27/2022, 11:45 AM

## 2022-05-30 ENCOUNTER — Encounter: Payer: Medicare Other | Admitting: Physical Therapy

## 2022-06-03 ENCOUNTER — Encounter: Payer: Medicare Other | Admitting: Physical Therapy

## 2022-06-06 ENCOUNTER — Encounter: Payer: Medicare Other | Admitting: Physical Therapy

## 2022-06-10 ENCOUNTER — Encounter: Payer: Medicare Other | Admitting: Physical Therapy

## 2022-06-13 ENCOUNTER — Encounter: Payer: Medicare Other | Admitting: Physical Therapy

## 2022-06-24 ENCOUNTER — Ambulatory Visit: Payer: Medicare Other | Admitting: Physical Therapy

## 2022-07-01 ENCOUNTER — Ambulatory Visit: Payer: Medicare Other | Admitting: Family Medicine

## 2022-07-01 ENCOUNTER — Encounter: Payer: Self-pay | Admitting: Family Medicine

## 2022-07-01 ENCOUNTER — Ambulatory Visit: Payer: Self-pay

## 2022-07-01 VITALS — BP 182/100 | Ht 73.0 in | Wt 290.0 lb

## 2022-07-01 DIAGNOSIS — M19011 Primary osteoarthritis, right shoulder: Secondary | ICD-10-CM | POA: Diagnosis not present

## 2022-07-01 DIAGNOSIS — M1712 Unilateral primary osteoarthritis, left knee: Secondary | ICD-10-CM

## 2022-07-01 MED ORDER — BETAMETHASONE SOD PHOS & ACET 6 (3-3) MG/ML IJ SUSP
6.0000 mg | Freq: Once | INTRAMUSCULAR | Status: AC
  Administered 2022-07-01: 6 mg via INTRA_ARTICULAR

## 2022-07-01 NOTE — Assessment & Plan Note (Signed)
Acute exacerbation of his underlying pain.  Did well with the previous injection. -Counseled on home exercise therapy and supportive care. -Injection today. -Could consider further imaging

## 2022-07-01 NOTE — Patient Instructions (Signed)
Good to see you Please try heat and ice on the shoulder  Please try the exercises   Please send me a message in MyChart with any questions or updates.  Please see me back in 6-8 weeks.   --Dr. Raeford Razor

## 2022-07-01 NOTE — Progress Notes (Signed)
  Danny Montoya - 71 y.o. male MRN [id number]  Date of birth: 1950-11-29  SUBJECTIVE:  Including CC & ROS.  No chief complaint on file.   Danny Montoya is a 71 y.o. male that is following up for his left knee pain and presenting with acute on chronic right shoulder pain.   Review of Systems See HPI   HISTORY: Past Medical, Surgical, Social, and Family History Reviewed & Updated per EMR.   Pertinent Historical Findings include:  Past Medical History:  Diagnosis Date   Diabetes mellitus ([place])    Gastroesophageal reflux disease    Hyperlipidemia    Hypertension    Obstructive sleep apnea     Past Surgical History:  Procedure Laterality Date   NECK SURGERY       PHYSICAL EXAM:  VS: BP (!) 182/100 (BP Location: Left Arm, Patient Position: Sitting)   Ht 6\' 1"  (1.854 m)   Wt 290 lb (131.5 kg)   BMI 38.26 kg/m  Physical Exam Gen: NAD, alert, cooperative with exam, well-appearing MSK:  Neurovascularly intact     Aspiration/Injection Procedure Note Danny Montoya 04/20/1951  Procedure: Injection Indications: Right shoulder pain  Procedure Details Consent: Risks of procedure as well as the alternatives and risks of each were explained to the (patient/caregiver).  Consent for procedure obtained. Time Out: Verified patient identification, verified procedure, site/side was marked, verified correct patient position, special equipment/implants available, medications/allergies/relevent history reviewed, required imaging and test results available.  Performed.  The area was cleaned with iodine and alcohol swabs.    The right glenohumeral joint was injected using 3 cc cc of 1% lidocaine on a 22-gauge 3-1/2 inch needle.  The syringe was switched and a mixture containing 1 cc's of 6 mg betamethasone and 4 cc's of 0.25% bupivacaine was injected.  Ultrasound was used. Images were obtained in short views showing the injection.     A sterile dressing was applied.  Patient did tolerate procedure  well.     ASSESSMENT & PLAN:   Primary osteoarthritis of left knee Has been doing well for the most part.  Does notice some pain intermittently. -Counseled on home exercise therapy and supportive care. -Could consider nerve block or gel injection.  OA (osteoarthritis) of shoulder Acute exacerbation of his underlying pain.  Did well with the previous injection. -Counseled on home exercise therapy and supportive care. -Injection today. -Could consider further imaging

## 2022-07-01 NOTE — Assessment & Plan Note (Signed)
Has been doing well for the most part.  Does notice some pain intermittently. -Counseled on home exercise therapy and supportive care. -Could consider nerve block or gel injection.

## 2022-07-11 ENCOUNTER — Other Ambulatory Visit: Payer: Self-pay

## 2022-07-11 DIAGNOSIS — I10 Essential (primary) hypertension: Secondary | ICD-10-CM

## 2022-07-11 MED ORDER — AMLODIPINE BESYLATE 5 MG PO TABS: 5.0000 mg | ORAL_TABLET | Freq: Every day | ORAL | 3 refills | Status: DC

## 2022-07-15 ENCOUNTER — Encounter: Payer: Self-pay | Admitting: Cardiology

## 2022-07-15 DIAGNOSIS — I10 Essential (primary) hypertension: Secondary | ICD-10-CM

## 2022-07-18 MED ORDER — AMLODIPINE BESYLATE 10 MG PO TABS: 10.0000 mg | ORAL_TABLET | Freq: Every day | ORAL | 3 refills | Status: DC

## 2022-07-18 NOTE — Addendum Note (Signed)
Addended by: Cristopher Estimable on: 07/18/2022 08:17 AM   Modules accepted: Orders

## 2022-08-26 ENCOUNTER — Ambulatory Visit: Payer: Medicare Other | Admitting: Family Medicine

## 2022-08-26 ENCOUNTER — Encounter: Payer: Self-pay | Admitting: Family Medicine

## 2022-08-26 VITALS — BP 120/82 | Ht 73.0 in | Wt 300.0 lb

## 2022-08-26 DIAGNOSIS — M1712 Unilateral primary osteoarthritis, left knee: Secondary | ICD-10-CM | POA: Diagnosis not present

## 2022-08-26 MED ORDER — HYDROCODONE-ACETAMINOPHEN 5-325 MG PO TABS: 1.0000 | ORAL_TABLET | Freq: Three times a day (TID) | ORAL | 0 refills | Status: DC | PRN

## 2022-08-26 NOTE — Progress Notes (Signed)
  Danny Montoya - 71 y.o. male MRN [id number]  Date of birth: Jul 06, 1951  SUBJECTIVE:  Including CC & ROS.  No chief complaint on file.   Danny Montoya is a 71 y.o. male that is  presenting with worsening of his right knee pain. We have tried injections, medication and physical therapy. It is interfering with quality of life. Unable to do the things he wants to do.   Review of Systems See HPI   HISTORY: Past Medical, Surgical, Social, and Family History Reviewed & Updated per EMR.   Pertinent Historical Findings include:  Past Medical History:  Diagnosis Date   Diabetes mellitus (HCC)    Gastroesophageal reflux disease    Hyperlipidemia    Hypertension    Obstructive sleep apnea     Past Surgical History:  Procedure Laterality Date   NECK SURGERY       PHYSICAL EXAM:  VS: BP 120/82   Ht 6\' 1"  (1.854 m)   Wt 300 lb (136.1 kg)   BMI 39.58 kg/m  Physical Exam Gen: NAD, alert, cooperative with exam, well-appearing MSK:  Neurovascularly intact       ASSESSMENT & PLAN:   Primary osteoarthritis of left knee Acute on chronic in nature. Has failed conservative measures.  - counseled on home exercise therapy and supportive care - referral to surgeon - hinged knee brace - norco.

## 2022-08-26 NOTE — Patient Instructions (Signed)
Good to see you Please use ice as needed  Please try the recumbent bike  You can consider glucosamine chondroitin, fish oil or tumeric  I have made a referral to the surgeon  Please use the pain medicine as needed   Please send me a message in MyChart with any questions or updates.  Please see me back as needed.   --Dr. Jordan Likes

## 2022-08-26 NOTE — Assessment & Plan Note (Signed)
Acute on chronic in nature. Has failed conservative measures.  - counseled on home exercise therapy and supportive care - referral to surgeon - hinged knee brace - norco.

## 2022-09-11 ENCOUNTER — Encounter: Payer: Self-pay | Admitting: Family Medicine

## 2022-09-11 ENCOUNTER — Ambulatory Visit (INDEPENDENT_AMBULATORY_CARE_PROVIDER_SITE_OTHER): Payer: Medicare Other | Admitting: Family Medicine

## 2022-09-11 VITALS — BP 138/67 | HR 97 | Ht 73.0 in | Wt 321.0 lb

## 2022-09-11 DIAGNOSIS — I10 Essential (primary) hypertension: Secondary | ICD-10-CM

## 2022-09-11 DIAGNOSIS — E1165 Type 2 diabetes mellitus with hyperglycemia: Secondary | ICD-10-CM | POA: Diagnosis not present

## 2022-09-11 DIAGNOSIS — Z794 Long term (current) use of insulin: Secondary | ICD-10-CM

## 2022-09-11 DIAGNOSIS — R351 Nocturia: Secondary | ICD-10-CM

## 2022-09-11 DIAGNOSIS — G4730 Sleep apnea, unspecified: Secondary | ICD-10-CM

## 2022-09-11 DIAGNOSIS — N5089 Other specified disorders of the male genital organs: Secondary | ICD-10-CM

## 2022-09-11 DIAGNOSIS — Z6841 Body Mass Index (BMI) 40.0 and over, adult: Secondary | ICD-10-CM | POA: Insufficient documentation

## 2022-09-11 MED ORDER — FREESTYLE LIBRE 14 DAY SENSOR MISC: 10 refills | Status: DC

## 2022-09-11 MED ORDER — TAMSULOSIN HCL 0.4 MG PO CAPS: 0.4000 mg | ORAL_CAPSULE | Freq: Every day | ORAL | 3 refills | Status: DC

## 2022-09-11 NOTE — Assessment & Plan Note (Signed)
-   continue current medication - repeat BP much better at 138/67

## 2022-09-11 NOTE — Assessment & Plan Note (Addendum)
-   likely due to excess calories. Have sent a referral to healthy weight and wellness to see if he can get proper nutrition counseling.  - weight gain could also be due to the fact that he is on insulin that could be contributing to weight gain. - we discussed dietary changes of trying to have eggs and [place] sausage in the morning. Discussed that we can do diet modification.

## 2022-09-11 NOTE — Assessment & Plan Note (Signed)
-   have sent new referral for sleep study

## 2022-09-11 NOTE — Patient Instructions (Addendum)
Today:   We are adding rybelsus 3mg  to your diabetic medications  - the goal is to try and decrease your meal time insulin once the rybelsus can help stabilize your medication dosage  - I am sending a referral to [facility] at [place] to see if they can help with diet and nutrition - we are checking your A1C today  - we will have you follow up in one month to make sure the 3mg  dose is going well and then we will increase to the 7mg  dose - you can decrease your metformin to 1000mg  daily   Sleep apnea - I will order a sleep study so we can get you set up with another cpap   Peeing at night - we are adding flomax to your medication list for you to take at night to help with nighttime peeing - try to stop drinking 2-3 hours prior to sleeping to see if this improves    I have sent a referral to urology, if you do not hear from anyone in two weeks please call back

## 2022-09-11 NOTE — Assessment & Plan Note (Signed)
-   pt would not let me look at the area. I expressed concern and that I would bring a male chaperone in the room. He still declined. I did however, place a referral for urology.

## 2022-09-11 NOTE — Progress Notes (Signed)
New Patient Office Visit  Subjective    Patient ID: Danny Montoya, male    DOB: 08-16-1951  Age: 71 y.o. MRN: [id number]  CC:  Chief Complaint  Patient presents with   Establish Care    HPI Danny Montoya presents to establish care.   He would like to discuss recent weight gain. He is unsure how he has gained more weight. He says he was 300lbs about 1-2 months ago and today his weight on our scale is 321lbs. He describes his diet as poor and says he eats eggs, sausage, grits, and a biscuit for breakfast each morning. He has noticed an increase in food consumption. He is on insulin and has noticed he has had to increase his mealtime insulin from 5units to now 15-20units TID. He tells me he felt like he could eat anything and then "shoot it away with insulin."  Diabetes - has freestyle libre  - metformin 1000mg  BID - last A1c was 7.5 (4 months ago)  - Toujeo 65u every morning  - humalog 15-20u per each meal TID - he was previously on ozempic and was taken off this due to price and he said he stopped noticing any weight loss benefit.   HTN He has a hx of htn that he said is managed by cardiology. He says his amlodipine was recently increased to 10mg . He denies chest pain, shortness of breath, blurry vision.   OSA - he tells me he has a history of OSA but has not been on CPAP in years. He does say he needs a new sleep study.  Nocturia Pt has been noticing more frequent awakenings at night to urinate and says he will get up about 5 times a night. He has tried to stop drinking liquids one hour before and doesn't notice much of a change.   Pt also mentions scrotal swelling that he let his previous provider know about. He does not want me examining this area today. He is also noticing his penis size has gotten smaller.  Outpatient Encounter Medications as of 09/11/2022  Medication Sig   amLODipine (NORVASC) 10 MG tablet Take 1 tablet (10 mg total) by mouth daily. (Patient taking differently: Take  5 mg by mouth daily.)   aspirin EC 81 MG tablet Take 1 tablet (81 mg total) by mouth daily. Swallow whole.   atorvastatin (LIPITOR) 80 MG tablet Take 80 mg by mouth daily.   chlorthalidone (HYGROTON) 25 MG tablet Take 25 mg by mouth once. Take 2 tablets po once daily   cholecalciferol (VITAMIN D3) 25 MCG (1000 UNIT) tablet Take 1,000 Units by mouth daily.   Continuous Blood Gluc Receiver (FREESTYLE LIBRE 14 DAY READER) DEVI by Does not apply route.   Continuous Blood Gluc Sensor (FREESTYLE LIBRE 14 DAY SENSOR) MISC by Does not apply route.   fenofibrate 160 MG tablet    gabapentin (NEURONTIN) 100 MG capsule Take 100 mg by mouth 3 (three) times daily.   glucose blood test strip 1 each by Other route as needed for other. Use as instructed   HUMALOG KWIKPEN 100 UNIT/ML KwikPen Inject into the skin.   INSULIN SYRINGE .5CC/29G (B-D INS SYR ULTRAFINE .5CC/29G) 29G X 1/2" 0.5 ML MISC by Does not apply route.   lisinopril (ZESTRIL) 40 MG tablet Take 40 mg by mouth daily.   meloxicam (MOBIC) 15 MG tablet Take 15 mg by mouth daily.   metFORMIN (GLUCOPHAGE) 1000 MG tablet Take 1,000 mg by mouth 2 (two) times daily.  prazosin (MINIPRESS) 1 MG capsule Take 1 mg by mouth 2 (two) times daily.   Semaglutide, 1 MG/DOSE, (OZEMPIC, 1 MG/DOSE,) 4 MG/3ML SOPN Inject into the skin.   tamsulosin (FLOMAX) 0.4 MG CAPS capsule Take 1 capsule (0.4 mg total) by mouth daily.   tiZANidine (ZANAFLEX) 4 MG capsule Take 4 mg by mouth 3 (three) times daily.   TOUJEO SOLOSTAR 300 UNIT/ML Solostar Pen    traMADol (ULTRAM) 50 MG tablet Take by mouth every 6 (six) hours as needed.   buPROPion (WELLBUTRIN SR) 100 MG 12 hr tablet Take 100 mg by mouth 2 (two) times daily.   Continuous Blood Gluc Sensor (FREESTYLE LIBRE 14 DAY SENSOR) MISC Place on the skin and change every 14 days.   glipiZIDE (GLUCOTROL XL) 10 MG 24 hr tablet Take 10 mg by mouth 2 (two) times daily.   HYDROcodone-acetaminophen (NORCO/VICODIN) 5-325 MG tablet Take  1 tablet by mouth every 8 (eight) hours as needed.   [DISCONTINUED] Continuous Blood Gluc Sensor (FREESTYLE LIBRE 14 DAY SENSOR) MISC by Does not apply route.   No facility-administered encounter medications on file as of 09/11/2022.    Past Medical History:  Diagnosis Date   Diabetes mellitus (HCC)    Gastroesophageal reflux disease    Hyperlipidemia    Hypertension    Obstructive sleep apnea     Past Surgical History:  Procedure Laterality Date   NECK SURGERY      Family History  Problem Relation Age of Onset   Coronary artery disease Mother    Coronary artery disease Sister     Social History   Socioeconomic History   Marital status: Single    Spouse name: Not on file   Number of children: Not on file   Years of education: Not on file   Highest education level: Not on file  Occupational History   Not on file  Tobacco Use   Smoking status: Never   Smokeless tobacco: Never  Substance and Sexual Activity   Alcohol use: Yes   Drug use: Not on file   Sexual activity: Not on file  Other Topics Concern   Not on file  Social History Narrative   Not on file   Social Determinants of Health   Financial Resource Strain: Not on file  Food Insecurity: Not on file  Transportation Needs: Not on file  Physical Activity: Not on file  Stress: Not on file  Social Connections: Not on file  Intimate Partner Violence: Not on file    Review of Systems  Constitutional:  Negative for chills and fever.  Respiratory:  Negative for cough and shortness of breath.   Cardiovascular:  Negative for chest pain.  Genitourinary:        Nocturia  Neurological:  Negative for headaches.        Objective    BP 138/67   Pulse 97   Ht 6\' 1"  (1.854 m)   Wt (!) 321 lb (145.6 kg)   BMI 42.35 kg/m   Physical Exam Vitals and nursing note reviewed.  Constitutional:      General: He is not in acute distress.    Appearance: Normal appearance.  HENT:     Head: Normocephalic and  atraumatic.     Right Ear: External ear normal.     Left Ear: External ear normal.     Nose: Nose normal.  Eyes:     Conjunctiva/sclera: Conjunctivae normal.  Cardiovascular:     Rate and Rhythm: Normal rate and  regular rhythm.  Pulmonary:     Effort: Pulmonary effort is normal.     Breath sounds: Normal breath sounds.  Neurological:     General: No focal deficit present.     Mental Status: He is alert and oriented to person, place, and time.  Psychiatric:        Mood and Affect: Mood normal.        Behavior: Behavior normal.        Thought Content: Thought content normal.        Judgment: Judgment normal.       Assessment & Plan:   Problem List Items Addressed This Visit       Cardiovascular and Mediastinum   Primary hypertension    - continue current medication - repeat BP much better at 138/67      Relevant Medications   chlorthalidone (HYGROTON) 25 MG tablet   Other Relevant Orders   CBC   COMPLETE METABOLIC PANEL WITH GFR     Respiratory   Sleep apnea    - have sent new referral for sleep study      Relevant Orders   Ambulatory referral to Sleep Studies     Endocrine   Type 2 diabetes mellitus with hyperglycemia, with long-term current use of insulin (HCC) - Primary    We are adding rybelsus 3mg  to your diabetic medications  - the goal is to try and decrease your meal time insulin once the rybelsus can help stabilize your medication dosage  - I am sending a referral to healthy weight and wellness at [place] to see if they can help with diet and nutrition - we are checking your A1C today  - we will have you follow up in one month to make sure the 3mg  dose is going well and then we will increase to the 7mg  dose - you can decrease your metformin to 1000mg  daily  - believe pt could have increase in urination due to excess sugars       Relevant Medications   Semaglutide, 1 MG/DOSE, (OZEMPIC, 1 MG/DOSE,) 4 MG/3ML SOPN   Continuous Blood Gluc  Sensor (FREESTYLE LIBRE 14 DAY SENSOR) MISC   Other Relevant Orders   HgB A1c   Lipid panel   Microalbumin / creatinine urine ratio     Other   Nocturia   Relevant Medications   tamsulosin (FLOMAX) 0.4 MG CAPS capsule   Other Relevant Orders   PSA, total and free   Scrotal swelling    - pt would not let me look at the area. I expressed concern and that I would bring a male chaperone in the room. He still declined. I did however, place a referral for urology.      Relevant Orders   Ambulatory referral to Urology   Class 3 severe obesity due to excess calories without serious comorbidity with body mass index (BMI) of 40.0 to 44.9 in adult [facility])    - likely due to excess calories. Have sent a referral to healthy weight and wellness to see if he can get proper nutrition counseling.  - weight gain could also be due to the fact that he is on insulin that could be contributing to weight gain.      Relevant Medications   Semaglutide, 1 MG/DOSE, (OZEMPIC, 1 MG/DOSE,) 4 MG/3ML SOPN   Other Relevant Orders   CBC   COMPLETE METABOLIC PANEL WITH GFR   Amb Ref to Medical Weight Management   Greater than  60min was spent in preparation for the visit, the actual visit, and time spent documenting the visit and follow up plans.   Return in about 4 weeks (around 10/09/2022) for rybelsus follow up (to increase dose if tolerating it well).   Charlton AmorErika S Cyara Devoto, DO

## 2022-09-11 NOTE — Assessment & Plan Note (Signed)
-   encouraged pt to stop drinking 3 hours prior to going to bed to see if that is what is causing nocturia - have also ordered a PSA to check for enlarged prostate - have started flomax to see if this makes a difference

## 2022-09-11 NOTE — Assessment & Plan Note (Signed)
We are adding rybelsus 3mg  to your diabetic medications  - the goal is to try and decrease your meal time insulin once the rybelsus can help stabilize your medication dosage  - I am sending a referral to healthy weight and wellness at [place] to see if they can help with diet and nutrition - we are checking your A1C today  - we will have you follow up in one month to make sure the 3mg  dose is going well and then we will increase to the 7mg  dose - you can decrease your metformin to 1000mg  daily  - believe pt could have increase in urination due to excess sugars

## 2022-09-15 LAB — CBC
HCT: 40.5 % (ref 38.5–50.0)
Hemoglobin: 13.8 g/dL (ref 13.2–17.1)
MCH: 30.3 pg (ref 27.0–33.0)
MCHC: 34.1 g/dL (ref 32.0–36.0)
MCV: 88.8 fL (ref 80.0–100.0)
MPV: 10.7 fL (ref 7.5–12.5)
Platelets: 289 10*3/uL (ref 140–400)
RBC: 4.56 10*6/uL (ref 4.20–5.80)
RDW: 12.7 % (ref 11.0–15.0)
WBC: 7 10*3/uL (ref 3.8–10.8)

## 2022-09-15 LAB — LIPID PANEL
Cholesterol: 195 mg/dL (ref <200)
HDL: 42 mg/dL (ref >=40)
LDL Cholesterol (Calc): 122 mg/dL (calc) — ABNORMAL HIGH
Non-HDL Cholesterol (Calc): 153 mg/dL (calc) — ABNORMAL HIGH (ref <130)
Total CHOL/HDL Ratio: 4.6 (calc) (ref <5.0)
Triglycerides: 184 mg/dL — ABNORMAL HIGH (ref <150)

## 2022-09-15 LAB — COMPLETE METABOLIC PANEL WITH GFR
AG Ratio: 1.8 (calc) (ref 1.0–2.5)
ALT: 19 U/L (ref 9–46)
AST: 18 U/L (ref 10–35)
Albumin: 4.4 g/dL (ref 3.6–5.1)
Alkaline phosphatase (APISO): 47 U/L (ref 35–144)
BUN/Creatinine Ratio: 24 (calc) — ABNORMAL HIGH (ref 6–22)
BUN: 31 mg/dL — ABNORMAL HIGH (ref 7–25)
CO2: 24 mmol/L (ref 20–32)
Calcium: 10.4 mg/dL — ABNORMAL HIGH (ref 8.6–10.3)
Chloride: 105 mmol/L (ref 98–110)
Creat: 1.31 mg/dL (ref 0.70–1.35)
Globulin: 2.5 g/dL (calc) (ref 1.9–3.7)
Glucose, Bld: 168 mg/dL — ABNORMAL HIGH (ref 65–139)
Potassium: 4.2 mmol/L (ref 3.5–5.3)
Sodium: 140 mmol/L (ref 135–146)
Total Bilirubin: 0.5 mg/dL (ref 0.2–1.2)
Total Protein: 6.9 g/dL (ref 6.1–8.1)
eGFR: 59 mL/min/{1.73_m2} — ABNORMAL LOW (ref >=60)

## 2022-09-15 LAB — MICROALBUMIN / CREATININE URINE RATIO
Creatinine, Urine: 121 mg/dL (ref 20–320)
Microalb Creat Ratio: 53 mcg/mg creat — ABNORMAL HIGH (ref <30)
Microalb, Ur: 6.4 mg/dL

## 2022-09-15 LAB — PSA, TOTAL AND FREE
PSA, % Free: 57 % (calc) (ref >25)
PSA, Free: 0.4 ng/mL
PSA, Total: 0.7 ng/mL (ref <=4.0)

## 2022-09-15 LAB — HEMOGLOBIN A1C
Hgb A1c MFr Bld: 8.5 % of total Hgb — ABNORMAL HIGH (ref <5.7)
Mean Plasma Glucose: 197 mg/dL
eAG (mmol/L): 10.9 mmol/L

## 2022-09-30 ENCOUNTER — Other Ambulatory Visit: Payer: Self-pay | Admitting: Family Medicine

## 2022-10-09 ENCOUNTER — Ambulatory Visit (INDEPENDENT_AMBULATORY_CARE_PROVIDER_SITE_OTHER): Payer: Medicare Other | Admitting: Family Medicine

## 2022-10-09 ENCOUNTER — Encounter: Payer: Self-pay | Admitting: Family Medicine

## 2022-10-09 VITALS — BP 107/68 | HR 107 | Ht 73.0 in | Wt 325.0 lb

## 2022-10-09 DIAGNOSIS — I10 Essential (primary) hypertension: Secondary | ICD-10-CM

## 2022-10-09 DIAGNOSIS — R600 Localized edema: Secondary | ICD-10-CM | POA: Diagnosis not present

## 2022-10-09 DIAGNOSIS — Z794 Long term (current) use of insulin: Secondary | ICD-10-CM

## 2022-10-09 DIAGNOSIS — N5089 Other specified disorders of the male genital organs: Secondary | ICD-10-CM | POA: Diagnosis not present

## 2022-10-09 DIAGNOSIS — E1165 Type 2 diabetes mellitus with hyperglycemia: Secondary | ICD-10-CM

## 2022-10-09 MED ORDER — TIRZEPATIDE 5 MG/0.5ML ~~LOC~~ SOAJ: 5.0000 mg | SUBCUTANEOUS | 0 refills | Status: DC

## 2022-10-09 MED ORDER — FUROSEMIDE 20 MG PO TABS: 20.0000 mg | ORAL_TABLET | Freq: Every day | ORAL | 0 refills | Status: DC

## 2022-10-09 MED ORDER — TIRZEPATIDE 7.5 MG/0.5ML ~~LOC~~ SOAJ: 7.5000 mg | SUBCUTANEOUS | 0 refills | Status: DC

## 2022-10-09 MED ORDER — TIRZEPATIDE 2.5 MG/0.5ML ~~LOC~~ SOAJ: 2.5000 mg | SUBCUTANEOUS | 0 refills | Status: DC

## 2022-10-09 NOTE — Progress Notes (Signed)
Danny Montoya - 72 y.o. male MRN [id number]  Date of birth: [birth year]  Subjective Chief Complaint  Patient presents with   Diabetes    HPI Danny Montoya is a 72 year old male here today for follow-up of diabetes.  Seen by Dr. Mel Almond previously.  Prior to that he was managed by endocrinology.  Diabetes has not been well-controlled.  Current management with Rybelsus, glipizide, Toujeo and Humalog.  Prescribed Ozempic when seeing endocrinology.  He did have pretty good response to this however reports that his insurance did not cover him moving from 1 mg to 2 mg strength.  Started on Rybelsus by Dr. Mel Almond.  He did complete 3 mg sample strength however reports that he did not pick up prescription from the pharmacy due to cost.  He admits that diet is not very good.  He has not made any significant changes to his diet since last visit.  His activity level remains low due to chronic knee pain.  He has been unable to have knee replacement due to BMI as well as poorly controlled diabetes.  He has noticed some increased swelling in his legs as well as scrotal edema.  No prior history of CHF.  He has not had any chest pain or dyspnea.  Echocardiogram completed in March of last year does show grade 1 diastolic dysfunction with normal LVEF.  Blood pressure is well-controlled at this time.    ROS:  A comprehensive ROS was completed and negative except as noted per HPI  Allergies  Allergen Reactions   Penicillins     When he was a child    Past Medical History:  Diagnosis Date   Diabetes mellitus ([place])    Gastroesophageal reflux disease    Hyperlipidemia    Hypertension    Obstructive sleep apnea     Past Surgical History:  Procedure Laterality Date   NECK SURGERY      Social History   Socioeconomic History   Marital status: Single    Spouse name: Not on file   Number of children: Not on file   Years of education: Not on file   Highest education level: Not on file  Occupational History   Not on file   Tobacco Use   Smoking status: Never   Smokeless tobacco: Never  Substance and Sexual Activity   Alcohol use: Yes   Drug use: Not on file   Sexual activity: Not on file  Other Topics Concern   Not on file  Social History Narrative   Not on file   Social Determinants of Health   Financial Resource Strain: Not on file  Food Insecurity: Not on file  Transportation Needs: Not on file  Physical Activity: Not on file  Stress: Not on file  Social Connections: Not on file    Family History  Problem Relation Age of Onset   Coronary artery disease Mother    Coronary artery disease Sister     Health Maintenance  Topic Date Due   Medicare Annual Wellness (AWV)  Never done   COVID-19 Vaccine (1) Never done   FOOT EXAM  Never done   OPHTHALMOLOGY EXAM  Never done   Hepatitis C Screening  Never done   COLONOSCOPY (Pts 45-47yr Insurance coverage will need to be confirmed)  Never done   Pneumonia Vaccine 70 Years old (2 - PCV) 10/10/2023 (Originally 09/21/2019)   INFLUENZA VACCINE  01/04/2024 (Originally 05/06/2022)   Zoster Vaccines- Shingrix (1 of 2) 01/08/2024 (Originally 06/13/2003)   HEMOGLOBIN A1C  03/13/2023   Diabetic kidney evaluation - eGFR measurement  09/12/2023   Diabetic kidney evaluation - Urine ACR  09/12/2023   DTaP/Tdap/Td (2 - Td or Tdap) 09/11/2026   HPV VACCINES  Aged Out     ----------------------------------------------------------------------------------------------------------------------------------------------------------------------------------------------------------------- Physical Exam BP 107/68 (BP Location: Left Arm, Patient Position: Sitting, Cuff Size: Large)   Pulse (!) 107   Ht _0  (1.854 m)   Wt (!) 325 lb (147.4 kg)   SpO2 97%   BMI 42.88 kg/m   Physical Exam Constitutional:      Appearance: He is obese.  HENT:     Head: Normocephalic and atraumatic.  Eyes:     General: No scleral icterus. Cardiovascular:     Rate and Rhythm:  Normal rate and regular rhythm.     Comments: 1+ bilateral edema. Pulmonary:     Effort: Pulmonary effort is normal.     Breath sounds: Normal breath sounds.  Neurological:     Mental Status: He is alert.  Psychiatric:        Mood and Affect: Mood normal.        Behavior: Behavior normal.     ------------------------------------------------------------------------------------------------------------------------------------------------------------------------------------------------------------------- Assessment and Plan  Primary hypertension Blood pressure remains well-controlled.  Recommend continuation of current medications for management of hypertension.  Type 2 diabetes mellitus with hyperglycemia, with long-term current use of insulin (HCC) Lab Results  Component Value Date   HGBA1C 8.5 (H) 09/11/2022  Diabetes is poorly controlled.  We discussed the importance of working on dietary change for both weight loss and blood glucose control.  Unfortunately Rybelsus was too expensive for him.  It does appear that Toujeo is preferred on his insurance and we will see if this is a less expensive.  Alternatively I did print out patient assistance forms for Ozempic/Rybelsus if Darcel Bayley is not covered.  Lower extremity edema He has lower extremity pitting edema as well as previous history of scrotal edema.  Adding Lasix 40 mg daily.  Return in 1 week for BMP.  Low-sodium diet recommended   Meds ordered this encounter  Medications   furosemide (LASIX) 20 MG tablet    Sig: Take 1 tablet (20 mg total) by mouth daily.    Dispense:  90 tablet    Refill:  0   tirzepatide (MOUNJARO) 2.5 MG/0.5ML Pen    Sig: Inject 2.5 mg into the skin once a week. Increase to 37m after 4 weeks.    Dispense:  2 mL    Refill:  0   tirzepatide (MOUNJARO) 5 MG/0.5ML Pen    Sig: Inject 5 mg into the skin once a week. Increase to 7.538mafter 4 weeks.    Dispense:  2 mL    Refill:  0   tirzepatide (MOUNJARO)  7.5 MG/0.5ML Pen    Sig: Inject 7.5 mg into the skin once a week.    Dispense:  6 mL    Refill:  0    Return in about 6 weeks (around 11/20/2022) for F/u Blood sugars/weight.    This visit occurred during the SARS-CoV-2 public health emergency.  Safety protocols were in place, including screening questions prior to the visit, additional usage of staff PPE, and extensive cleaning of exam room while observing appropriate contact time as indicated for disinfecting solutions.

## 2022-10-09 NOTE — Assessment & Plan Note (Signed)
Blood pressure remains well controlled.  Recommend continuation of current medications for management of hypertension. ?

## 2022-10-09 NOTE — Assessment & Plan Note (Deleted)
He has scrotal edema as well as pitting lower extremity edema.  Adding Lasix 40 mg daily.  Return in 1 week for BMP.  Low-sodium diet recommended

## 2022-10-09 NOTE — Patient Instructions (Addendum)
Take lasix daily x10 days then as needed for swelling.  Return after 1 week to recheck kidney function and potassium levels.   Let's see if mounjaro is less expensive for you.   If this is still expensive complete the form for patient assistance for Ozempic.   Follow up in 6 weeks.

## 2022-10-09 NOTE — Assessment & Plan Note (Signed)
Lab Results  Component Value Date   HGBA1C 8.5 (H) 09/11/2022  Diabetes is poorly controlled.  We discussed the importance of working on dietary change for both weight loss and blood glucose control.  Unfortunately Rybelsus was too expensive for him.  It does appear that Toujeo is preferred on his insurance and we will see if this is a less expensive.  Alternatively I did print out patient assistance forms for Ozempic/Rybelsus if Darcel Bayley is not covered.

## 2022-10-09 NOTE — Assessment & Plan Note (Signed)
He has lower extremity pitting edema as well as previous history of scrotal edema.  Adding Lasix 40 mg daily.  Return in 1 week for BMP.  Low-sodium diet recommended

## 2022-10-28 ENCOUNTER — Other Ambulatory Visit: Payer: Self-pay | Admitting: Family Medicine

## 2022-10-30 ENCOUNTER — Telehealth: Payer: Self-pay

## 2022-10-30 DIAGNOSIS — I10 Essential (primary) hypertension: Secondary | ICD-10-CM

## 2022-10-30 DIAGNOSIS — E1165 Type 2 diabetes mellitus with hyperglycemia: Secondary | ICD-10-CM

## 2022-10-30 MED ORDER — HUMALOG KWIKPEN 100 UNIT/ML ~~LOC~~ SOPN: PEN_INJECTOR | SUBCUTANEOUS | 2 refills | Status: DC

## 2022-10-30 MED ORDER — LISINOPRIL 40 MG PO TABS: 40.0000 mg | ORAL_TABLET | Freq: Every day | ORAL | 1 refills | Status: DC

## 2022-10-30 MED ORDER — CHLORTHALIDONE 25 MG PO TABS: 25.0000 mg | ORAL_TABLET | Freq: Once | ORAL | 0 refills | Status: DC

## 2022-10-30 MED ORDER — TIRZEPATIDE 2.5 MG/0.5ML ~~LOC~~ SOAJ: 2.5000 mg | SUBCUTANEOUS | 0 refills | Status: DC

## 2022-10-30 MED ORDER — PRAZOSIN HCL 1 MG PO CAPS: 1.0000 mg | ORAL_CAPSULE | Freq: Two times a day (BID) | ORAL | 1 refills | Status: DC

## 2022-10-30 MED ORDER — MELOXICAM 15 MG PO TABS: 15.0000 mg | ORAL_TABLET | Freq: Every day | ORAL | 1 refills | Status: DC

## 2022-10-30 NOTE — Telephone Encounter (Signed)
Called pt left a voicemail to call back and schedule a lab appt

## 2022-10-30 NOTE — Telephone Encounter (Signed)
Sent medication refills  

## 2022-11-12 ENCOUNTER — Other Ambulatory Visit: Payer: Self-pay

## 2022-11-12 DIAGNOSIS — I10 Essential (primary) hypertension: Secondary | ICD-10-CM

## 2022-11-12 MED ORDER — CHLORTHALIDONE 25 MG PO TABS: 50.0000 mg | ORAL_TABLET | Freq: Every day | ORAL | 0 refills | Status: DC

## 2022-11-13 MED ORDER — PRAZOSIN HCL 1 MG PO CAPS: 1.0000 mg | ORAL_CAPSULE | Freq: Two times a day (BID) | ORAL | 1 refills | Status: DC

## 2022-11-18 ENCOUNTER — Other Ambulatory Visit: Payer: Self-pay

## 2022-11-18 DIAGNOSIS — I10 Essential (primary) hypertension: Secondary | ICD-10-CM

## 2022-11-18 MED ORDER — CHLORTHALIDONE 25 MG PO TABS: 50.0000 mg | ORAL_TABLET | Freq: Every day | ORAL | 0 refills | Status: DC

## 2022-11-19 ENCOUNTER — Other Ambulatory Visit: Payer: Self-pay | Admitting: Family Medicine

## 2022-11-19 DIAGNOSIS — E1165 Type 2 diabetes mellitus with hyperglycemia: Secondary | ICD-10-CM

## 2022-11-19 DIAGNOSIS — R351 Nocturia: Secondary | ICD-10-CM

## 2022-11-20 ENCOUNTER — Ambulatory Visit (INDEPENDENT_AMBULATORY_CARE_PROVIDER_SITE_OTHER): Payer: Medicare Other | Admitting: Family Medicine

## 2022-11-20 ENCOUNTER — Ambulatory Visit (INDEPENDENT_AMBULATORY_CARE_PROVIDER_SITE_OTHER): Payer: Medicare Other

## 2022-11-20 ENCOUNTER — Encounter: Payer: Self-pay | Admitting: Family Medicine

## 2022-11-20 VITALS — BP 106/66 | HR 105 | Ht 73.0 in | Wt 319.0 lb

## 2022-11-20 DIAGNOSIS — M542 Cervicalgia: Secondary | ICD-10-CM | POA: Diagnosis not present

## 2022-11-20 DIAGNOSIS — E1165 Type 2 diabetes mellitus with hyperglycemia: Secondary | ICD-10-CM

## 2022-11-20 DIAGNOSIS — I10 Essential (primary) hypertension: Secondary | ICD-10-CM | POA: Diagnosis not present

## 2022-11-20 DIAGNOSIS — Z794 Long term (current) use of insulin: Secondary | ICD-10-CM | POA: Diagnosis not present

## 2022-11-20 DIAGNOSIS — M25511 Pain in right shoulder: Secondary | ICD-10-CM

## 2022-11-20 DIAGNOSIS — M47812 Spondylosis without myelopathy or radiculopathy, cervical region: Secondary | ICD-10-CM | POA: Diagnosis not present

## 2022-11-20 MED ORDER — TIRZEPATIDE 10 MG/0.5ML ~~LOC~~ SOAJ: 10.0000 mg | SUBCUTANEOUS | 1 refills | Status: DC

## 2022-11-20 MED ORDER — TIRZEPATIDE 7.5 MG/0.5ML ~~LOC~~ SOAJ: 7.5000 mg | SUBCUTANEOUS | 0 refills | Status: DC

## 2022-11-20 MED ORDER — HUMALOG KWIKPEN 100 UNIT/ML ~~LOC~~ SOPN: PEN_INJECTOR | SUBCUTANEOUS | 2 refills | Status: DC

## 2022-11-20 MED ORDER — GABAPENTIN 300 MG PO CAPS: 300.0000 mg | ORAL_CAPSULE | Freq: Three times a day (TID) | ORAL | 3 refills | Status: DC

## 2022-11-20 NOTE — Patient Instructions (Signed)
Increase moujaro to 7.68m weekly.  If tolerating well go ahead and increase to 120mafter 1 month.   For arm pain let's try gabapentin.  Start 30054mt bedtime for 3-4 days.  You can increase this to three times per day if needed.  Follow up in 8 weeks.

## 2022-11-20 NOTE — Progress Notes (Signed)
Danny Montoya - 72 y.o. male MRN [id number]  Date of birth: 28-Apr-1951  Subjective Chief Complaint  Patient presents with   Hypertension   Diabetes    HPI Danny Montoya is a 72 y.o. male here today for follow up visit.   Mounjaro added at last visit.  Reports he is doing well with this.  No issues with tolerance so far.  Weight is down about 6lbs since last visit.  He has not made any significant changes to his diet ofr exercise habits.  He does continue on humalog 20-25 units with each meal.  He is using Freestyle lIbre CGM.    Continues to have knee pain and pain in the R shoulderr.  He does see orthopedics.  Injections have not really been helpful.  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  He is having increased R sided neck pain and notes some pain that is radiating down the R arm as well.  Denies numbness or tingling sensation.  Meloxicamn helps some.  Had prescription for norco previously.  He has gabapentin listed but does not recall taking this.     ROS:  A comprehensive ROS was completed and negative except as noted per HPI   Allergies  Allergen Reactions   Penicillins     When he was a child    Past Medical History:  Diagnosis Date   Diabetes mellitus ([place])    Gastroesophageal reflux disease    Hyperlipidemia    Hypertension    Obstructive sleep apnea     Past Surgical History:  Procedure Laterality Date   NECK SURGERY      Social History   Socioeconomic History   Marital status: Single    Spouse name: Not on file   Number of children: Not on file   Years of education: Not on file   Highest education level: Not on file  Occupational History   Not on file  Tobacco Use   Smoking status: Never   Smokeless tobacco: Never  Substance and Sexual Activity   Alcohol use: Yes   Drug use: Not on file   Sexual activity: Not on file  Other Topics Concern   Not on file  Social History Narrative   Not on file   Social Determinants of Health   Financial Resource Strain:  Not on file  Food Insecurity: Not on file  Transportation Needs: Not on file  Physical Activity: Not on file  Stress: Not on file  Social Connections: Not on file    Family History  Problem Relation Age of Onset   Coronary artery disease Mother    Coronary artery disease Sister     Health Maintenance  Topic Date Due   FOOT EXAM  Never done   Hepatitis C Screening  Never done   COLONOSCOPY (Pts 45-69yr Insurance coverage will need to be confirmed)  Never done   OPHTHALMOLOGY EXAM  04/06/2023 (Originally 06/13/1963)   Medicare Annual Wellness (AWV)  04/06/2023 (Originally 91954/10/09   Pneumonia Vaccine 70 Years old (2 of 2 - PCV) 10/10/2023 (Originally 09/21/2019)   COVID-19 Vaccine (1) 12/06/2023 (Originally 12/10/1951)   INFLUENZA VACCINE  01/04/2024 (Originally 05/06/2022)   Zoster Vaccines- Shingrix (1 of 2) 01/08/2024 (Originally 06/13/2003)   HEMOGLOBIN A1C  03/13/2023   Diabetic kidney evaluation - eGFR measurement  09/12/2023   Diabetic kidney evaluation - Urine ACR  09/12/2023   DTaP/Tdap/Td (2 - Td or Tdap) 09/11/2026   HPV VACCINES  Aged Out     -----------------------------------------------------------------------------------------------------------------------------------------------------------------------------------------------------------------  Physical Exam BP 106/66 (BP Location: Left Arm, Patient Position: Sitting, Cuff Size: Large)   Pulse (!) 105   Ht 6' 1"$  (1.854 m)   Wt (!) 319 lb (144.7 kg)   SpO2 97%   BMI 42.09 kg/m   Physical Exam Constitutional:      Appearance: Normal appearance.  HENT:     Head: Normocephalic and atraumatic.  Eyes:     General: No scleral icterus. Cardiovascular:     Rate and Rhythm: Normal rate and regular rhythm.  Pulmonary:     Effort: Pulmonary effort is normal.     Breath sounds: Normal breath sounds.  Neurological:     Mental Status: He is alert.  Psychiatric:        Mood and Affect: Mood normal.        Behavior:  Behavior normal.     ------------------------------------------------------------------------------------------------------------------------------------------------------------------------------------------------------------------- Assessment and Plan  Type 2 diabetes mellitus with hyperglycemia, with long-term current use of insulin ([place]) Tolerating mounjaro well so far.  He wishes to go ahead and increase to the 7.58m strength.  He will let me know if intolerant to this.  Updated rx for humalog to reflect his current dosing.  Exercise is limited due to pain.  Encouraged dietary changes.    Primary hypertension BP is well controlled at this time.  Recommend continuation of current medications.    Neck pain on right side Having increased neck pain with radicular symptoms.  Xrays of the cervical spine ordered.  Adding gabapentin 3023mqhs x3-4 days and then may increase to TID if needed.     Meds ordered this encounter  Medications   gabapentin (NEURONTIN) 300 MG capsule    Sig: Take 1 capsule (300 mg total) by mouth 3 (three) times daily.    Dispense:  90 capsule    Refill:  3   HUMALOG KWIKPEN 100 UNIT/ML KwikPen    Sig: Inject 20-25 units with each meal.  May use up to 30 units with each meal.    Dispense:  15 mL    Refill:  2   tirzepatide (MOUNJARO) 7.5 MG/0.5ML Pen    Sig: Inject 7.5 mg into the skin once a week. May increase to 1064mfter 28 days if tolerating well.    Dispense:  2 mL    Refill:  0   tirzepatide (MOUNJARO) 10 MG/0.5ML Pen    Sig: Inject 10 mg into the skin once a week.    Dispense:  2 mL    Refill:  1    Return in about 2 months (around 01/19/2023) for F/u Dr. WacMel Almondr T2DM.    This visit occurred during the SARS-CoV-2 public health emergency.  Safety protocols were in place, including screening questions prior to the visit, additional usage of staff PPE, and extensive cleaning of exam room while observing appropriate contact time as indicated for  disinfecting solutions.

## 2022-11-20 NOTE — Assessment & Plan Note (Signed)
BP is well controlled at this time.  Recommend continuation of current medications.

## 2022-11-20 NOTE — Assessment & Plan Note (Signed)
Tolerating mounjaro well so far.  He wishes to go ahead and increase to the 7.38m strength.  He will let me know if intolerant to this.  Updated rx for humalog to reflect his current dosing.  Exercise is limited due to pain.  Encouraged dietary changes.

## 2022-11-20 NOTE — Assessment & Plan Note (Signed)
Having increased neck pain with radicular symptoms.  Xrays of the cervical spine ordered.  Adding gabapentin 357m qhs x3-4 days and then may increase to TID if needed.

## 2022-11-21 ENCOUNTER — Other Ambulatory Visit: Payer: Self-pay

## 2022-11-21 NOTE — Telephone Encounter (Signed)
Routing to covering provider.   [facility] requesting med refill on tamsulosin. Pt is on prazosin rx. Is refill appropriate? Please advise, thanks.

## 2022-11-25 ENCOUNTER — Encounter (INDEPENDENT_AMBULATORY_CARE_PROVIDER_SITE_OTHER): Payer: Self-pay

## 2022-12-02 DIAGNOSIS — G4710 Hypersomnia, unspecified: Secondary | ICD-10-CM | POA: Diagnosis not present

## 2022-12-05 ENCOUNTER — Other Ambulatory Visit: Payer: Self-pay | Admitting: Family Medicine

## 2022-12-06 DIAGNOSIS — G4733 Obstructive sleep apnea (adult) (pediatric): Secondary | ICD-10-CM | POA: Diagnosis not present

## 2022-12-08 ENCOUNTER — Encounter: Payer: Self-pay | Admitting: *Deleted

## 2022-12-08 ENCOUNTER — Other Ambulatory Visit: Payer: Self-pay | Admitting: *Deleted

## 2022-12-08 DIAGNOSIS — I7121 Aneurysm of the ascending aorta, without rupture: Secondary | ICD-10-CM

## 2022-12-12 ENCOUNTER — Other Ambulatory Visit: Payer: Self-pay

## 2022-12-12 ENCOUNTER — Other Ambulatory Visit: Payer: Self-pay | Admitting: Family Medicine

## 2022-12-12 DIAGNOSIS — R351 Nocturia: Secondary | ICD-10-CM

## 2022-12-12 MED ORDER — TIZANIDINE HCL 4 MG PO CAPS: 4.0000 mg | ORAL_CAPSULE | Freq: Three times a day (TID) | ORAL | 0 refills | Status: AC

## 2022-12-16 ENCOUNTER — Ambulatory Visit: Payer: Medicare Other | Admitting: Family Medicine

## 2022-12-16 ENCOUNTER — Telehealth: Payer: Self-pay | Admitting: *Deleted

## 2022-12-16 ENCOUNTER — Encounter: Payer: Self-pay | Admitting: Family Medicine

## 2022-12-16 ENCOUNTER — Ambulatory Visit: Payer: Self-pay

## 2022-12-16 ENCOUNTER — Ambulatory Visit (HOSPITAL_BASED_OUTPATIENT_CLINIC_OR_DEPARTMENT_OTHER)
Admission: RE | Admit: 2022-12-16 | Discharge: 2022-12-16 | Disposition: A | Discharge status: Home / Self Care | Payer: Medicare Other | Source: Ambulatory Visit | Attending: Family Medicine | Admitting: Family Medicine

## 2022-12-16 VITALS — BP 158/90 | Ht 73.0 in | Wt 319.0 lb

## 2022-12-16 DIAGNOSIS — M19011 Primary osteoarthritis, right shoulder: Secondary | ICD-10-CM

## 2022-12-16 DIAGNOSIS — M25511 Pain in right shoulder: Secondary | ICD-10-CM | POA: Diagnosis not present

## 2022-12-16 DIAGNOSIS — M170 Bilateral primary osteoarthritis of knee: Secondary | ICD-10-CM | POA: Diagnosis not present

## 2022-12-16 MED ORDER — TRIAMCINOLONE ACETONIDE 40 MG/ML IJ SUSP
40.0000 mg | Freq: Once | INTRAMUSCULAR | Status: AC
  Administered 2022-12-16: 40 mg via INTRA_ARTICULAR

## 2022-12-16 NOTE — Telephone Encounter (Signed)
Verified benefits for bilateral Zilretta.

## 2022-12-16 NOTE — Patient Instructions (Signed)
Good to see you Please use ice as needed   We'll call with the xray results.  We have made a referral to physical therapy  Please send me a message in MyChart with any questions or updates.  Please see me back for the zilretta injections.   --Dr. Raeford Razor

## 2022-12-16 NOTE — Progress Notes (Signed)
  Danny Montoya - 72 y.o. male MRN [id number]  Date of birth: [birth year]  SUBJECTIVE:  Including CC & ROS.  No chief complaint on file.   Danny Montoya is a 72 y.o. male that is presenting with acute on chronic bilateral knee pain and acute right shoulder pain.  He had a fall onto his right side in December.  Continues to have pain in the right arm.    Review of Systems See HPI   HISTORY: Past Medical, Surgical, Social, and Family History Reviewed & Updated per EMR.   Pertinent Historical Findings include:  Past Medical History:  Diagnosis Date   Diabetes mellitus ([place])    Gastroesophageal reflux disease    Hyperlipidemia    Hypertension    Obstructive sleep apnea     Past Surgical History:  Procedure Laterality Date   NECK SURGERY       PHYSICAL EXAM:  VS: BP (!) 158/90 (BP Location: Left Arm, Patient Position: Sitting)   Ht 6\' 1"  (1.854 m)   Wt (!) 319 lb (144.7 kg)   BMI 42.09 kg/m  Physical Exam Gen: NAD, alert, cooperative with exam, well-appearing MSK:  Neurovascularly intact     Aspiration/Injection Procedure Note [name] [birth year]  Procedure: Injection Indications: right shoulder pain  Procedure Details Consent: Risks of procedure as well as the alternatives and risks of each were explained to the (patient/caregiver).  Consent for procedure obtained. Time Out: Verified patient identification, verified procedure, site/side was marked, verified correct patient position, special equipment/implants available, medications/allergies/relevent history reviewed, required imaging and test results available.  Performed.  The area was cleaned with iodine and alcohol swabs.    The right glenohumeral joint was injected using 3.4 cc of 8.4% sodium bicarbonate and 4 cc of 1% lidocaine on a 22-gauge 3-1/2 inch needle.  The syringe was switched to mixture containing 1 cc's of 40 mg Kenalog and 4 cc's of 0.25% bupivacaine was injected.  Ultrasound was used. Images were obtained in short  views showing the injection.     A sterile dressing was applied.  Patient did tolerate procedure well.     ASSESSMENT & PLAN:   OA (osteoarthritis) of shoulder Acute on chronic in nature.  He had a fall a few months ago and continues to have pain and limited range of motion. -Counseled on home exercise therapy and supportive care. -Injection today. -Referral to physical therapy. -Could consider further imaging.  Primary osteoarthritis of both knees Acute on chronic in nature.  Has known degenerative changes in each knee.  He is having worsening stiffness and trouble walking.  Having to use a cane and feels unsteady on his feet. -Counseled on exercise therapy and supportive care. -Would pursue bilateral Zilretta injections. -Referral to physical therapy. -Could consider arthroplasty once BMI is improved.

## 2022-12-16 NOTE — Addendum Note (Signed)
Addended by: Cresenciano Lick on: 12/16/2022 11:04 AM   Modules accepted: Orders

## 2022-12-16 NOTE — Assessment & Plan Note (Signed)
Acute on chronic in nature.  Has known degenerative changes in each knee.  He is having worsening stiffness and trouble walking.  Having to use a cane and feels unsteady on his feet. -Counseled on exercise therapy and supportive care. -Would pursue bilateral Zilretta injections. -Referral to physical therapy. -Could consider arthroplasty once BMI is improved.

## 2022-12-16 NOTE — Assessment & Plan Note (Signed)
Acute on chronic in nature.  He had a fall a few months ago and continues to have pain and limited range of motion. -Counseled on home exercise therapy and supportive care. -Injection today. -Referral to physical therapy. -Could consider further imaging.

## 2022-12-17 ENCOUNTER — Telehealth: Payer: Self-pay | Admitting: Family Medicine

## 2022-12-17 NOTE — Telephone Encounter (Signed)
No prior authorization or referral needed. Patient responsibility for [phone] Orvan Seen) will be 20% with the remaining covered at 80% by the payer at the contracted rate. Patient is responsible for a $20 copay for CPT code 20610 with the remaining covered at 100% by the payer at contracted rate. Deductibles do not apply to these services. Patient has a $20 copay whether or not an office visit is billed. Only one copay applies per date of service. Patient has an out of pocket maximum of $3600 and has accumulated $50. If out of pocket is met, coverage goes to 100% and copays will no longer apply.   Patient informed and is scheduled.

## 2022-12-17 NOTE — Telephone Encounter (Signed)
Left VM for patient. If he calls back please have him speak with a nurse/CMA and inform that his xray didn't show a fracture.   If any questions then please take the best time and phone number to call and I will try to call him back.   Rosemarie Ax, MD [facility] 12/17/2022, 1:19 PM

## 2022-12-18 ENCOUNTER — Encounter: Payer: Self-pay | Admitting: Family Medicine

## 2022-12-18 ENCOUNTER — Other Ambulatory Visit: Payer: Self-pay

## 2022-12-18 ENCOUNTER — Ambulatory Visit: Payer: Medicare Other | Admitting: Family Medicine

## 2022-12-18 VITALS — BP 160/98 | Ht 73.0 in | Wt 320.0 lb

## 2022-12-18 DIAGNOSIS — M170 Bilateral primary osteoarthritis of knee: Secondary | ICD-10-CM

## 2022-12-18 MED ORDER — TRIAMCINOLONE ACETONIDE 32 MG IX SRER
32.0000 mg | Freq: Once | INTRA_ARTICULAR | Status: AC
  Administered 2022-12-18: 32 mg via INTRA_ARTICULAR

## 2022-12-18 NOTE — Patient Instructions (Signed)
Good to see you Please use ice as needed  Please send me a message in MyChart with any questions or updates.  Please see me back as scheduled.   --Dr. Vikash Nest  

## 2022-12-18 NOTE — Progress Notes (Signed)
  Danny Montoya - 72 y.o. male MRN [id number]  Date of birth: 09/24/1951  SUBJECTIVE:  Including CC & ROS.  No chief complaint on file.   Danny Montoya is a 72 y.o. male that is  here for zilretta injections.    Review of Systems See HPI   HISTORY: Past Medical, Surgical, Social, and Family History Reviewed & Updated per EMR.   Pertinent Historical Findings include:  Past Medical History:  Diagnosis Date   Diabetes mellitus ([place])    Gastroesophageal reflux disease    Hyperlipidemia    Hypertension    Obstructive sleep apnea     Past Surgical History:  Procedure Laterality Date   NECK SURGERY       PHYSICAL EXAM:  VS: There were no vitals taken for this visit. Physical Exam Gen: NAD, alert, cooperative with exam, well-appearing MSK:  Neurovascularly intact     Aspiration/Injection Procedure Note Danny Montoya 22-Nov-1950  Procedure: Aspiration and Injection Indications: left knee pain  Procedure Details Consent: Risks of procedure as well as the alternatives and risks of each were explained to the (patient/caregiver).  Consent for procedure obtained. Time Out: Verified patient identification, verified procedure, site/side was marked, verified correct patient position, special equipment/implants available, medications/allergies/relevent history reviewed, required imaging and test results available.  Performed.  The area was cleaned with iodine and alcohol swabs.    The left knee superior lateral suprapatellar pouch was injected using 3 cc of 1% lidocaine and 0.3 cc of 8.4% sodium bicarbonate on a 22-gauge 2 inch needle.  An 18-gauge 1-1/2 needle was used to achieve aspiration.  The syringe was switched and a mixture containing 5 cc's of 32 mg Zilretta and 4 cc's of 0.25% bupivacaine was injected.  Ultrasound was used. Images were obtained in long views showing the injection.   Amount of Fluid Aspirated:  81mL Character of Fluid: clear and straw colored Fluid was sent for: n/a  A  sterile dressing was applied.  Patient did tolerate procedure well.     Aspiration/Injection Procedure Note Danny Montoya 06/02/1951  Procedure: Injection Indications: right knee pain   Procedure Details Consent: Risks of procedure as well as the alternatives and risks of each were explained to the (patient/caregiver).  Consent for procedure obtained. Time Out: Verified patient identification, verified procedure, site/side was marked, verified correct patient position, special equipment/implants available, medications/allergies/relevent history reviewed, required imaging and test results available.  Performed.  The area was cleaned with iodine and alcohol swabs.    The right knee superior lateral suprapatellar pouch was injected using 3 cc of 1% lidocaine and 0.3 cc of 8.4% sodium bicarbonate on a 22-gauge 2 inch needle.  The syringe was switched and a mixture containing 5 cc's of 32 mg Zilretta and 4 cc's of 0.25% bupivacaine was injected.  Ultrasound was used. Images were obtained in long views showing the injection.    A sterile dressing was applied.  Patient did tolerate procedure well.       ASSESSMENT & PLAN:   Primary osteoarthritis of both knees Completed bilateral zilretta injections

## 2022-12-18 NOTE — Assessment & Plan Note (Signed)
Completed bilateral zilretta injections.  

## 2022-12-19 ENCOUNTER — Ambulatory Visit (INDEPENDENT_AMBULATORY_CARE_PROVIDER_SITE_OTHER): Payer: Medicare Other | Admitting: Family Medicine

## 2022-12-19 ENCOUNTER — Encounter: Payer: Self-pay | Admitting: Family Medicine

## 2022-12-19 VITALS — BP 168/81 | HR 88 | Ht 73.0 in | Wt 319.0 lb

## 2022-12-19 DIAGNOSIS — E1165 Type 2 diabetes mellitus with hyperglycemia: Secondary | ICD-10-CM | POA: Diagnosis not present

## 2022-12-19 DIAGNOSIS — Z794 Long term (current) use of insulin: Secondary | ICD-10-CM | POA: Diagnosis not present

## 2022-12-19 DIAGNOSIS — R011 Cardiac murmur, unspecified: Secondary | ICD-10-CM | POA: Diagnosis not present

## 2022-12-19 DIAGNOSIS — R609 Edema, unspecified: Secondary | ICD-10-CM

## 2022-12-19 DIAGNOSIS — R600 Localized edema: Secondary | ICD-10-CM

## 2022-12-19 DIAGNOSIS — R0602 Shortness of breath: Secondary | ICD-10-CM | POA: Diagnosis not present

## 2022-12-19 DIAGNOSIS — I10 Essential (primary) hypertension: Secondary | ICD-10-CM

## 2022-12-19 LAB — POCT GLYCOSYLATED HEMOGLOBIN (HGB A1C): Hemoglobin A1C: 5.8 % — AB (ref 4.0–5.6)

## 2022-12-19 MED ORDER — TIRZEPATIDE 7.5 MG/0.5ML ~~LOC~~ SOAJ: 7.5000 mg | SUBCUTANEOUS | 3 refills | Status: DC

## 2022-12-19 MED ORDER — SPIRONOLACTONE 25 MG PO TABS: 25.0000 mg | ORAL_TABLET | Freq: Every day | ORAL | 3 refills | Status: DC

## 2022-12-19 NOTE — Assessment & Plan Note (Signed)
-   3+ pitting edema - with murmur heard on exam have ordered echo and bnp to evaluate for signs of heart failure  - if echo is normal this could also be due to amlodipine usage and we could do a trial off to see if this improves  - continue chlorthalidone

## 2022-12-19 NOTE — Progress Notes (Signed)
Established patient visit   Patient: Danny Montoya   DOB: 09-16-1951   72 y.o. Male  MRN: [id number] Visit Date: 12/19/2022  Today's healthcare provider: Owens Loffler, DO   Chief Complaint  Patient presents with   Diabetes    SUBJECTIVE    Chief Complaint  Patient presents with   Diabetes   HPI  Pt presents for T2DM follow up - on metformin 1000mg  daily - on mounjaro but has been out of it for two weeks - humalog 30u before meals - toujeo 60u in the am - POC A1c: 5.8%   HTN - amlodipine 10mg   - chlorthalidone 50mg   - lisinopril 40mg    Review of Systems  Constitutional:  Negative for activity change, fatigue and fever.  Respiratory:  Negative for cough and shortness of breath.        Bilateral leg swelling  Cardiovascular:  Negative for chest pain.  Gastrointestinal:  Negative for abdominal pain.  Genitourinary:  Negative for difficulty urinating.       Current Meds  Medication Sig   amLODipine (NORVASC) 10 MG tablet Take 1 tablet (10 mg total) by mouth daily. (Patient taking differently: Take 5 mg by mouth daily.)   aspirin EC 81 MG tablet Take 1 tablet (81 mg total) by mouth daily. Swallow whole.   atorvastatin (LIPITOR) 80 MG tablet Take 80 mg by mouth daily.   buPROPion (WELLBUTRIN SR) 100 MG 12 hr tablet Take 100 mg by mouth 2 (two) times daily.   chlorthalidone (HYGROTON) 25 MG tablet Take 2 tablets (50 mg total) by mouth daily.   Continuous Blood Gluc Receiver (FREESTYLE LIBRE 14 DAY READER) DEVI by Does not apply route.   fenofibrate 160 MG tablet    furosemide (LASIX) 20 MG tablet TAKE 1 TABLET BY MOUTH DAILY   gabapentin (NEURONTIN) 300 MG capsule Take 1 capsule (300 mg total) by mouth 3 (three) times daily.   glipiZIDE (GLUCOTROL XL) 10 MG 24 hr tablet Take 10 mg by mouth 2 (two) times daily.   glucose blood test strip 1 each by Other route as needed for other. Use as instructed   HUMALOG KWIKPEN 100 UNIT/ML KwikPen Inject 20-25 units with each meal.   May use up to 30 units with each meal.   INSULIN SYRINGE .5CC/29G (B-D INS SYR ULTRAFINE .5CC/29G) 29G X 1/2" 0.5 ML MISC by Does not apply route.   lisinopril (ZESTRIL) 40 MG tablet Take 1 tablet (40 mg total) by mouth daily.   meloxicam (MOBIC) 15 MG tablet Take 1 tablet (15 mg total) by mouth daily.   metFORMIN (GLUCOPHAGE) 1000 MG tablet Take 1,000 mg by mouth daily with breakfast.   prazosin (MINIPRESS) 1 MG capsule Take 1 capsule (1 mg total) by mouth 2 (two) times daily.   spironolactone (ALDACTONE) 25 MG tablet Take 1 tablet (25 mg total) by mouth daily.   tamsulosin (FLOMAX) 0.4 MG CAPS capsule TAKE 1 CAPSULE BY MOUTH DAILY   tirzepatide (MOUNJARO) 7.5 MG/0.5ML Pen Inject 7.5 mg into the skin once a week.   tiZANidine (ZANAFLEX) 4 MG capsule Take 1 capsule (4 mg total) by mouth 3 (three) times daily.   TOUJEO SOLOSTAR 300 UNIT/ML Solostar Pen 60 Units.    OBJECTIVE    BP (!) 168/81   Pulse 88   Ht 6\' 1"  (1.854 m)   Wt (!) 319 lb (144.7 kg)   SpO2 96%   BMI 42.09 kg/m   Physical Exam Vitals and nursing note  reviewed.  Constitutional:      General: He is not in acute distress.    Appearance: Normal appearance.  HENT:     Head: Normocephalic and atraumatic.     Right Ear: External ear normal.     Left Ear: External ear normal.     Nose: Nose normal.  Eyes:     Conjunctiva/sclera: Conjunctivae normal.  Cardiovascular:     Rate and Rhythm: Normal rate and regular rhythm.     Heart sounds: Murmur heard.     Comments: 3+ pitting edema bilateral lower extremities  Pulmonary:     Effort: Pulmonary effort is normal.     Breath sounds: Normal breath sounds.  Neurological:     General: No focal deficit present.     Mental Status: He is alert and oriented to person, place, and time.  Psychiatric:        Mood and Affect: Mood normal.        Behavior: Behavior normal.        Thought Content: Thought content normal.        Judgment: Judgment normal.        ASSESSMENT &  PLAN    Problem List Items Addressed This Visit       Cardiovascular and Mediastinum   Primary hypertension    - uncontrolled despite triple therapy maxed out  - will add spironolactone 25mg  to regimen to help with heart protection as well as control BP  - am checking bmp today to monitor baseline for hyperK with the combo of lisinopril and spironolactone however with clorthalidone on board I believe it should control K levels      Relevant Medications   spironolactone (ALDACTONE) 25 MG tablet     Endocrine   Type 2 diabetes mellitus with hyperglycemia, with long-term current use of insulin ([place]) - Primary    - reordered mounjaro 7.5mg  since patient says that he has been unable to get the 10mg  dose  - have decreased tuojeo to 30u in the am since poc A1c today was 5.8% which is improvement from 8.5 3 months ago - did encourage diet and exercise. A lot of this is going to be diet driven with getting him off insulin  - follow up in one month for efficacy and tolerability        Relevant Medications   tirzepatide (MOUNJARO) 7.5 MG/0.5ML Pen   Other Relevant Orders   POCT HgB A1C (Completed)     Other   Lower extremity edema    - 3+ pitting edema - with murmur heard on exam have ordered echo and bnp to evaluate for signs of heart failure  - if echo is normal this could also be due to amlodipine usage and we could do a trial off to see if this improves  - continue chlorthalidone      Other Visit Diagnoses     Murmur, cardiac       Relevant Orders   ECHOCARDIOGRAM COMPLETE   3+ pitting edema       Relevant Orders   BASIC METABOLIC PANEL WITH GFR   B Nat Peptide   ECHOCARDIOGRAM COMPLETE       Return in about 4 weeks (around 01/16/2023).      Meds ordered this encounter  Medications   tirzepatide (MOUNJARO) 7.5 MG/0.5ML Pen    Sig: Inject 7.5 mg into the skin once a week.    Dispense:  6 mL    Refill:  3  spironolactone (ALDACTONE) 25 MG tablet    Sig: Take 1  tablet (25 mg total) by mouth daily.    Dispense:  90 tablet    Refill:  3    Orders Placed This Encounter  Procedures   BASIC METABOLIC PANEL WITH GFR   B Nat Peptide   POCT HgB A1C   ECHOCARDIOGRAM COMPLETE    Standing Status:   Future    Standing Expiration Date:   12/19/2023    Order Specific Question:   Where should this test be performed    Answer:   [facility]    Order Specific Question:   Perflutren DEFINITY (image enhancing agent) should be administered unless hypersensitivity or allergy exist    Answer:   Administer Perflutren    Order Specific Question:   Reason for exam-Echo    Answer:   Other-Full Diagnosis List    Order Specific Question:   Reason for exam-Echo    Answer:   Murmur R01.1    Order Specific Question:   Full ICD-10/Reason for Exam    Answer:   Pitting edema [id number]     Owens Loffler, [place] at [facility] [phone] (phone) [phone] (fax)  [place]

## 2022-12-19 NOTE — Patient Instructions (Signed)
Toujeo decrease to 30u in the am

## 2022-12-19 NOTE — Assessment & Plan Note (Signed)
-   uncontrolled despite triple therapy maxed out  - will add spironolactone 25mg  to regimen to help with heart protection as well as control BP  - am checking bmp today to monitor baseline for hyperK with the combo of lisinopril and spironolactone however with clorthalidone on board I believe it should control K levels

## 2022-12-19 NOTE — Assessment & Plan Note (Signed)
-   reordered mounjaro 7.5mg  since patient says that he has been unable to get the 10mg  dose  - have decreased tuojeo to 30u in the am since poc A1c today was 5.8% which is improvement from 8.5 3 months ago - did encourage diet and exercise. A lot of this is going to be diet driven with getting him off insulin  - follow up in one month for efficacy and tolerability

## 2022-12-20 LAB — BASIC METABOLIC PANEL WITH GFR
BUN/Creatinine Ratio: 27 (calc) — ABNORMAL HIGH (ref 6–22)
BUN: 33 mg/dL — ABNORMAL HIGH (ref 7–25)
CO2: 26 mmol/L (ref 20–32)
Calcium: 11.4 mg/dL — ABNORMAL HIGH (ref 8.6–10.3)
Chloride: 102 mmol/L (ref 98–110)
Creat: 1.23 mg/dL (ref 0.70–1.35)
Glucose, Bld: 262 mg/dL — ABNORMAL HIGH (ref 65–99)
Potassium: 4.8 mmol/L (ref 3.5–5.3)
Sodium: 138 mmol/L (ref 135–146)
eGFR: 64 mL/min/{1.73_m2} (ref >=60)

## 2022-12-20 LAB — BRAIN NATRIURETIC PEPTIDE: Brain Natriuretic Peptide: 66 pg/mL (ref <100)

## 2022-12-27 DIAGNOSIS — G4733 Obstructive sleep apnea (adult) (pediatric): Secondary | ICD-10-CM | POA: Diagnosis not present

## 2023-01-01 ENCOUNTER — Telehealth: Payer: Self-pay

## 2023-01-01 ENCOUNTER — Other Ambulatory Visit: Payer: Self-pay | Admitting: Family Medicine

## 2023-01-01 DIAGNOSIS — E1165 Type 2 diabetes mellitus with hyperglycemia: Secondary | ICD-10-CM

## 2023-01-01 MED ORDER — HUMALOG KWIKPEN 100 UNIT/ML ~~LOC~~ SOPN: PEN_INJECTOR | SUBCUTANEOUS | 3 refills | Status: DC

## 2023-01-01 NOTE — Progress Notes (Signed)
Pt requested refill on humalog. Refill sent to pharmacy

## 2023-01-03 ENCOUNTER — Other Ambulatory Visit: Payer: Self-pay | Admitting: Family Medicine

## 2023-01-03 DIAGNOSIS — I10 Essential (primary) hypertension: Secondary | ICD-10-CM

## 2023-01-05 NOTE — Telephone Encounter (Signed)
PCP?

## 2023-01-06 DIAGNOSIS — G4733 Obstructive sleep apnea (adult) (pediatric): Secondary | ICD-10-CM | POA: Diagnosis not present

## 2023-01-16 ENCOUNTER — Ambulatory Visit (INDEPENDENT_AMBULATORY_CARE_PROVIDER_SITE_OTHER): Payer: Medicare Other | Admitting: Family Medicine

## 2023-01-16 ENCOUNTER — Encounter: Payer: Self-pay | Admitting: Family Medicine

## 2023-01-16 VITALS — BP 126/82 | HR 105 | Temp 98.7°F | Ht 73.0 in | Wt 312.0 lb

## 2023-01-16 DIAGNOSIS — E1165 Type 2 diabetes mellitus with hyperglycemia: Secondary | ICD-10-CM

## 2023-01-16 DIAGNOSIS — R600 Localized edema: Secondary | ICD-10-CM | POA: Diagnosis not present

## 2023-01-16 DIAGNOSIS — Z794 Long term (current) use of insulin: Secondary | ICD-10-CM | POA: Diagnosis not present

## 2023-01-16 DIAGNOSIS — I10 Essential (primary) hypertension: Secondary | ICD-10-CM | POA: Diagnosis not present

## 2023-01-16 MED ORDER — HUMALOG KWIKPEN 100 UNIT/ML ~~LOC~~ SOPN: PEN_INJECTOR | SUBCUTANEOUS | 7 refills | Status: DC

## 2023-01-16 MED ORDER — OZEMPIC (0.25 OR 0.5 MG/DOSE) 2 MG/3ML ~~LOC~~ SOPN: 0.2500 mg | PEN_INJECTOR | SUBCUTANEOUS | 0 refills | Status: DC

## 2023-01-16 NOTE — Assessment & Plan Note (Signed)
Is well-controlled on current regimen.  He is concerned because his home readings do have elevated readings.  However, he is taking his blood pressure in the morning prior to taking his medication.  I advised him next time to bring his machine to clinic so we can see if it is calibrated with our machine.  No changes today as far as his blood pressure regimen. - Will obtain BMP to assess for hyperkalemia since we did start him on spironolactone last time

## 2023-01-16 NOTE — Progress Notes (Addendum)
Established patient visit   Patient: Danny Montoya   DOB: April 30, 1951   72 y.o. Male  MRN: [id number] Visit Date: 01/16/2023  Today's healthcare provider: Charlton Amor, DO   Chief Complaint  Patient presents with   Diabetes    Hasn't been able to get [facility] He also states that he has been using more of his Insulin. He states running out 2 weeks early.    SUBJECTIVE    Chief Complaint  Patient presents with   Diabetes    Hasn't been able to get [facility]. He also states that he has been using more of his Insulin. He states running out 2 weeks early.   HPI  Pt presents for follow up on T2DM and HTN.   T2DDM Last visit he was prescribed mounjaro 7.5mg  since the  dose was out of stock however the mounjaro 7.5mg  is out of stock for him as well.  He has not been able to be on a GLP-1  HTN - BP uncontrolled at last visit  - added spironolactone to regimen of amlodipine , chlorthalidone , and lisinopril   - BMP at last visit shows no concerns for hyperkalemia, will recheck today - BP well controlled today at 126/82  Peripheral edema - last visit pt had 3+ pitting edema and echo was ordered with plan if signs of decreased ejection fraction likely this is related to chf and if normal will stop amlodipine -Is much improved today  Review of Systems  Constitutional:  Negative for activity change, fatigue and fever.  Respiratory:  Negative for cough and shortness of breath.   Cardiovascular:  Negative for chest pain.  Gastrointestinal:  Negative for abdominal pain.  Genitourinary:  Negative for difficulty urinating.       Current Meds  Medication Sig   amLODipine (NORVASC) 10 MG tablet Take 1 tablet (10 mg total) by mouth daily. (Patient taking differently: Take 5 mg by mouth daily.)   aspirin EC 81 MG tablet Take 1 tablet (81 mg total) by mouth daily. Swallow whole.   atorvastatin (LIPITOR) 80 MG tablet Take 80 mg by mouth daily.   buPROPion (WELLBUTRIN SR)  100 MG 12 hr tablet Take 100 mg by mouth 2 (two) times daily.   chlorthalidone (HYGROTON) 25 MG tablet Take 2 tablets (50 mg total) by mouth daily.   Continuous Blood Gluc Receiver (FREESTYLE LIBRE 14 DAY READER) DEVI by Does not apply route.   Continuous Blood Gluc Sensor (FREESTYLE LIBRE 2 SENSOR) MISC PLACE ON SKIN AND CHANGE EVERY  14 DAYS   fenofibrate 160 MG tablet    furosemide (LASIX) 20 MG tablet TAKE 1 TABLET BY MOUTH DAILY   gabapentin (NEURONTIN) 300 MG capsule Take 1 capsule (300 mg total) by mouth 3 (three) times daily.   glipiZIDE (GLUCOTROL XL) 10 MG 24 hr tablet Take 10 mg by mouth 2 (two) times daily.   glucose blood test strip 1 each by Other route as needed for other. Use as instructed   INSULIN SYRINGE .5CC/29G (B-D INS SYR ULTRAFINE .5CC/29G) 29G X 1/2" 0.5 ML MISC by Does not apply route.   lisinopril (ZESTRIL) 40 MG tablet TAKE 1 TABLET BY MOUTH DAILY   meloxicam (MOBIC) 15 MG tablet Take 1 tablet (15 mg total) by mouth daily.   metFORMIN (GLUCOPHAGE) 1000 MG tablet Take 1,000 mg by mouth daily with breakfast.   prazosin (MINIPRESS) 1 MG capsule Take 1 capsule (1 mg total) by mouth 2 (two) times daily.  Semaglutide,0.25 or 0.5MG /DOS, (OZEMPIC, 0.25 OR 0.5 MG/DOSE,) 2 MG/3ML SOPN Inject 0.25 mg into the skin once a week.   spironolactone (ALDACTONE) 25 MG tablet Take 1 tablet (25 mg total) by mouth daily.   tamsulosin (FLOMAX) 0.4 MG CAPS capsule TAKE 1 CAPSULE BY MOUTH DAILY   [DISCONTINUED] HUMALOG KWIKPEN 100 UNIT/ML KwikPen Inject 20-25 units with each meal.  May use up to 30 units with each meal.    OBJECTIVE    BP 126/82   Pulse (!) 105   Temp 98.7 F (37.1 C) (Oral)   Ht  (1.854 m)   Wt (!) 312 lb 0.6 oz (141.5 kg)   SpO2 95%   BMI 41.17 kg/m   Physical Exam Vitals and nursing note reviewed.  Constitutional:      General: He is not in acute distress.    Appearance: Normal appearance.  HENT:     Head: Normocephalic and atraumatic.     Right  Ear: External ear normal.     Left Ear: External ear normal.     Nose: Nose normal.  Eyes:     Conjunctiva/sclera: Conjunctivae normal.  Cardiovascular:     Rate and Rhythm: Normal rate and regular rhythm.  Pulmonary:     Effort: Pulmonary effort is normal.     Breath sounds: Normal breath sounds.  Neurological:     General: No focal deficit present.     Mental Status: He is alert and oriented to person, place, and time.  Psychiatric:        Mood and Affect: Mood normal.        Behavior: Behavior normal.        Thought Content: Thought content normal.        Judgment: Judgment normal.       ASSESSMENT & PLAN    Problem List Items Addressed This Visit       Cardiovascular and Mediastinum   Primary hypertension    Is well-controlled on current regimen.  He is concerned because his home readings do have elevated readings.  However, he is taking his blood pressure in the morning prior to taking his medication.  I advised him next time to bring his machine to clinic so we can see if it is calibrated with our machine.  No changes today as far as his blood pressure regimen. - Will obtain BMP to assess for hyperkalemia since we did start him on spironolactone last time        Endocrine   Type 2 diabetes mellitus with hyperglycemia, with long-term current use of insulin - Primary    - Needed refill on insulin and I have provided him with this.  His out-of-pocket cost for Mounjaro and or Ozempic is high and due to the [employer] being out of stock he has not been able to be on a GLP-1 and he has been using more insulin and thus his refills are needing to be filled sooner. - We did discuss starting Ozempic and patient said he was on this previously a few years ago and it worked well for him.  I do believe this would be a better medication option for him due to the shortages of Mounjaro currently.  I have given patient a sample of Ozempic and we will bring him back in 4 weeks to assess his  efficacy at the point to 0.25 mg dosing. - given sample of ozempic      Relevant Medications   HUMALOG KWIKPEN 100 UNIT/ML  KwikPen   Semaglutide,0.25 or 0.5MG /DOS, (OZEMPIC, 0.25 OR 0.5 MG/DOSE,) 2 MG/3ML SOPN   Other Relevant Orders   BASIC METABOLIC PANEL WITH GFR (Completed)     Other   Lower extremity edema    Return in about 4 weeks (around 02/13/2023).      Meds ordered this encounter  Medications   HUMALOG KWIKPEN 100 UNIT/ML KwikPen    Sig: Inject 30 units with each meal.    Dispense:  15 mL    Refill:  7   Semaglutide,0.25 or 0.5MG /DOS, (OZEMPIC, 0.25 OR 0.5 MG/DOSE,) 2 MG/3ML SOPN    Sig: Inject 0.25 mg into the skin once a week.    Dispense:  3 mL    Refill:  0    Orders Placed This Encounter  Procedures   BASIC METABOLIC PANEL WITH GFR     Charlton Amor, DO  [facility] at [facility] [phone] (phone) [phone] (fax)  [facility]

## 2023-01-16 NOTE — Assessment & Plan Note (Addendum)
-   Needed refill on insulin and I have provided him with this.  His out-of-pocket cost for Mounjaro and or Ozempic is high and due to the [employer] being out of stock he has not been able to be on a GLP-1 and he has been using more insulin and thus his refills are needing to be filled sooner. - We did discuss starting Ozempic and patient said he was on this previously a few years ago and it worked well for him.  I do believe this would be a better medication option for him due to the shortages of Mounjaro currently.  I have given patient a sample of Ozempic and we will bring him back in 4 weeks to assess his efficacy at the point to 0.25 mg dosing. - given sample of ozempic

## 2023-01-17 ENCOUNTER — Encounter: Payer: Self-pay | Admitting: Family Medicine

## 2023-01-17 LAB — BASIC METABOLIC PANEL WITH GFR
BUN/Creatinine Ratio: 16 (calc) (ref 6–22)
BUN: 23 mg/dL (ref 7–25)
CO2: 25 mmol/L (ref 20–32)
Calcium: 10.9 mg/dL — ABNORMAL HIGH (ref 8.6–10.3)
Chloride: 103 mmol/L (ref 98–110)
Creat: 1.43 mg/dL — ABNORMAL HIGH (ref 0.70–1.35)
Glucose, Bld: 183 mg/dL — ABNORMAL HIGH (ref 65–99)
Potassium: 5 mmol/L (ref 3.5–5.3)
Sodium: 138 mmol/L (ref 135–146)
eGFR: 53 mL/min/{1.73_m2} — ABNORMAL LOW (ref >=60)

## 2023-01-19 ENCOUNTER — Other Ambulatory Visit: Payer: Self-pay | Admitting: Family Medicine

## 2023-01-19 ENCOUNTER — Other Ambulatory Visit: Payer: Medicare Other | Admitting: Pharmacist

## 2023-01-19 ENCOUNTER — Encounter: Payer: Self-pay | Admitting: *Deleted

## 2023-01-19 DIAGNOSIS — G4733 Obstructive sleep apnea (adult) (pediatric): Secondary | ICD-10-CM | POA: Diagnosis not present

## 2023-01-19 DIAGNOSIS — E1165 Type 2 diabetes mellitus with hyperglycemia: Secondary | ICD-10-CM

## 2023-01-19 DIAGNOSIS — I10 Essential (primary) hypertension: Secondary | ICD-10-CM

## 2023-01-19 NOTE — Progress Notes (Unsigned)
01/20/2023 Name: Danny Montoya MRN: [id number] DOB: [birth year]  Chief Complaint  Patient presents with   Diabetes   Medication Assistance    Danny Montoya is a 72 y.o. year old male who presented for a telephone visit.   They were referred to the pharmacist by their PCP for assistance in managing diabetes and medication access.    Subjective:  Care Team: Primary Care Provider: Charlton Amor, DO ; Next Scheduled Visit: 02/13/23   Medication Access/Adherence  Current Pharmacy:  [facility]) - [place], [place] - [address] [facility] [address] [place] [address] [place] [address] Phone: [phone] Fax: [phone]  [facility] [address], [place] [address] [place] [address] Phone: [phone] Fax: [phone]  [facility] - [place], [place] - [address] [place] [address] Phone: [phone] Fax: [phone]   Patient reports affordability concerns with their medications:  ozempic  Patient reports access/transportation concerns to their pharmacy: No  Patient reports adherence concerns with their medications:  No     Diabetes:  Current medications:  humalog 20-25 units TID with meals, glipizide  BID, metformin  daily, toujeo 30 units daily AM. Started ozempic 0.25mg  weekly using sample from recent PCP visit (has also tolerated well in the past)  Current glucose readings: BG 260 after food, pt reports it drastically depends on when take shot and when eat. BG 186 at time of call. Using [place] 2 meter; testing continuously   Patient denies hypoglycemic s/sx including dizziness, shakiness, sweating. Patient denies hyperglycemic symptoms including polyuria, polydipsia, polyphagia, nocturia, neuropathy, blurred vision.  Current meal patterns: to be discussed at future visits   Current medication access support:  pending   Objective:  Lab Results  Component Value Date   HGBA1C 5.8 (A) 12/19/2022    Lab Results  Component Value Date   CREATININE 1.43 (H) 01/16/2023   BUN 23 01/16/2023   NA 138 01/16/2023   K 5.0 01/16/2023   CL 103 01/16/2023   CO2 25 01/16/2023    Lab Results  Component Value Date   CHOL 195 09/11/2022   HDL 42 09/11/2022   LDLCALC 122 (H) 09/11/2022   TRIG 184 (H) 09/11/2022   CHOLHDL 4.6 09/11/2022    Medications Reviewed Today     Reviewed by Gabriel Carina, RPH (Pharmacist) on 01/19/23 at 1055  Med List Status: <None>   Medication Order Taking? Sig Documenting Provider Last Dose Status Informant  amLODipine (NORVASC) 10 MG tablet [id number] Yes Take 1 tablet (10 mg total) by mouth daily. Lewayne Bunting, MD Taking Active   aspirin EC 81 MG tablet [id number] Yes Take 1 tablet (81 mg total) by mouth daily. Swallow whole. Lewayne Bunting, MD Taking Active   atorvastatin (LIPITOR) 80 MG tablet [id number] Yes Take 80 mg by mouth daily. [provider] Taking Active   buPROPion [facility] SR) 100 MG 12 hr tablet [id number] Yes Take 100 mg by mouth 2 (two) times daily. [provider] Taking Active   chlorthalidone (HYGROTON) 25 MG tablet [id number] Yes Take 2 tablets (50 mg total) by mouth daily.  Patient taking differently: Take 25 mg by mouth daily.   Everrett Coombe, DO Taking Active   Continuous Blood Gluc Receiver (FREESTYLE LIBRE 14 DAY READER) DEVI [id number] Yes by Does not apply route. [provider] Taking Active   Continuous Blood Gluc Sensor (  FREESTYLE LIBRE 2 SENSOR) MISC [id number] Yes PLACE ON SKIN AND CHANGE EVERY  14 DAYS Charlton Amor, DO Taking Active   fenofibrate 160 MG tablet [id number] Yes  [provider] Taking Active   furosemide (LASIX) 20 MG tablet [id number] Yes TAKE 1 TABLET BY MOUTH DAILY Everrett Coombe, DO Taking Active   gabapentin (NEURONTIN) 300 MG capsule [id number] No Take 1 capsule (300 mg total)  by mouth 3 (three) times daily.  Patient not taking: Reported on 01/19/2023   Everrett Coombe, DO Not Taking Active   glipiZIDE (GLUCOTROL XL) 10 MG 24 hr tablet [id number] Yes Take 10 mg by mouth 2 (two) times daily. [provider] Taking Active   glucose blood test strip [id number] Yes 1 each by Other route as needed for other. Use as instructed [provider] Taking Active   HUMALOG KWIKPEN 100 UNIT/ML KwikPen [id number] Yes Inject 30 units with each meal. Tamera Punt, Erika S, DO Taking Active   INSULIN SYRINGE .5CC/29G (B-D INS SYR ULTRAFINE .5CC/29G) 29G X 1/2" 0.5 ML MISC [id number] Yes by Does not apply route. [provider] Taking Active   lisinopril (ZESTRIL) 40 MG tablet [id number] Yes TAKE 1 TABLET BY MOUTH DAILY Tamera Punt, Erika S, DO Taking Active   meloxicam (MOBIC) 15 MG tablet [id number] Yes Take 1 tablet (15 mg total) by mouth daily. Everrett Coombe, DO Taking Active   metFORMIN (GLUCOPHAGE) 1000 MG tablet [id number] Yes Take 1,000 mg by mouth daily with breakfast. [provider] Taking Active   prazosin (MINIPRESS) 1 MG capsule [id number] Yes Take 1 capsule (1 mg total) by mouth 2 (two) times daily. Everrett Coombe, DO Taking Active   Semaglutide,0.25 or 0.5MG /DOS, (OZEMPIC, 0.25 OR 0.5 MG/DOSE,) 2 MG/3ML SOPN [id number] Yes Inject 0.25 mg into the skin once a week. Charlton Amor, DO Taking Active   spironolactone (ALDACTONE) 25 MG tablet [id number] Yes Take 1 tablet (25 mg total) by mouth daily. Charlton Amor, DO Taking Active   tamsulosin (FLOMAX) 0.4 MG CAPS capsule [id number] Yes TAKE 1 CAPSULE BY MOUTH DAILY Everrett Coombe, DO Taking Active   TOUJEO SOLOSTAR 300 UNIT/ML Solostar Pen [id number] Yes 60 Units. [provider] Taking Active               Assessment/Plan:   Diabetes: - Currently controlled, though patient and provider desire GLP1 therapy for added benefit of CV risk reduction and weight loss, however cost prohibitive. - Reviewed  goal A1c, goal fasting, and goal 2 hour post prandial glucose - Recommend to continue current regimen for now, continue using ozempic 0.25mg  weekly (sample provided by PCP at recent office visit), and we will pursue [employer]isk patient assistance for the following to reduce medication costs:   ----> ozempic 1mg  weekly  -----> toujeo switch to tresiba (to obtain through same program)  -----> humalog switch to novolog (to obtain through same program)  -----> pen needles  - Recommend to check glucose using CGM as current practice - Meets financial criteria for ozempic, tresiba, novolog, and pen needles patient assistance program through [employer]. Will collaborate with provider, CPhT, and patient to pursue assistance.     Follow Up Plan: 1 month or sooner if patient assistance program has updates  Lynnda Shields, PharmD, BCPS Clinical Pharmacist [facility]

## 2023-01-20 ENCOUNTER — Other Ambulatory Visit: Payer: Self-pay | Admitting: Family Medicine

## 2023-01-22 ENCOUNTER — Telehealth: Payer: Self-pay

## 2023-01-22 ENCOUNTER — Other Ambulatory Visit: Payer: Self-pay | Admitting: Family Medicine

## 2023-01-22 DIAGNOSIS — I10 Essential (primary) hypertension: Secondary | ICD-10-CM

## 2023-01-22 LAB — HM DIABETES EYE EXAM

## 2023-01-22 NOTE — Telephone Encounter (Signed)
Submitted  ONLINE TO COMPANY application for NOVOLOG FLEXPEN, TRESIBA FLEXTOUCH, OZEMPIC,  AND pen needles    to [employer] for patient assistance.   Phone: [phone]  [facility] Patient Advocate [phone]) [phone]

## 2023-01-28 ENCOUNTER — Other Ambulatory Visit: Payer: Self-pay | Admitting: Family Medicine

## 2023-01-28 NOTE — Addendum Note (Signed)
Addended by: Charlton Amor on: 01/28/2023 09:33 AM   Modules accepted: Level of Service

## 2023-02-10 ENCOUNTER — Encounter: Payer: Self-pay | Admitting: Family Medicine

## 2023-02-11 NOTE — Progress Notes (Signed)
Care Coordination  Reviewing med access, patient has upcoming PCP visit. Online application submitted for novolog, tresiba, ozmepic, pen needles. Will seek update with Rx med assistance team.  Lynnda Shields, PharmD, BCPS Clinical Pharmacist [facility]

## 2023-02-12 ENCOUNTER — Other Ambulatory Visit: Payer: Self-pay | Admitting: Family Medicine

## 2023-02-12 DIAGNOSIS — R351 Nocturia: Secondary | ICD-10-CM

## 2023-02-13 ENCOUNTER — Ambulatory Visit: Payer: Medicare Other | Admitting: Family Medicine

## 2023-02-13 ENCOUNTER — Encounter: Payer: Self-pay | Admitting: Family Medicine

## 2023-02-16 ENCOUNTER — Other Ambulatory Visit: Payer: Medicare Other | Admitting: Pharmacist

## 2023-02-16 NOTE — Telephone Encounter (Signed)
ReSubmitted application for TRESIBA  [facility] OZEMPIC AND PEN NEEDLES to [employer] for patient assistance.    WAS MISSING INSURANCE CARD 02/16/2023  [facility] Patient Advocate [phone]

## 2023-02-18 DIAGNOSIS — G4733 Obstructive sleep apnea (adult) (pediatric): Secondary | ICD-10-CM | POA: Diagnosis not present

## 2023-02-18 NOTE — Patient Instructions (Signed)
Danny Montoya,  Thank you for speaking with me today! As discussed, we've arranged another sample of ozempic for you to use until your application processes with the manufacturer. You can increase ozempic dosing to 0.5mg  weekly.  If you have questions, you can reach me at [phone].  Take care, Elmarie Shiley, PharmD, BCPS Clinical Pharmacist [facility]

## 2023-02-18 NOTE — Progress Notes (Signed)
02/18/2023 Name: Danny Montoya MRN: [id number] DOB: 12-30-1950  Chief Complaint  Patient presents with   Diabetes   Medication Assistance    Danny Montoya is a 72 y.o. year old male who presented for a telephone visit.   They were referred to the pharmacist by their PCP for assistance in managing diabetes and medication access.    Subjective:  Care Team: Primary Care Provider: Charlton Amor, DO ; Next Scheduled Visit: 02/13/23   Medication Access/Adherence  Current Pharmacy:  [facility]) - [place], [place] - [address] [facility] [address] [place] [address] [place] [address] Phone: [phone] Fax: [phone]  [facility] [address] - [address], [place] [address] [place] [address] Phone: [phone] Fax: [phone]  [facility] - [place], [place] - [address] [place] [address] Phone: [phone] Fax: [phone]   Patient reports affordability concerns with their medications:  ozempic  Patient reports access/transportation concerns to their pharmacy: No  Patient reports adherence concerns with their medications:  No     Diabetes:  Current medications:  humalog 20-25 units TID with meals, glipizide 10mg  BID, metformin 1000mg  daily, toujeo 50 units daily AM. Started ozempic 0.25mg  weekly using sample from recent PCP visit (has also tolerated well in the past)  Current glucose readings: BG 180s fasting, 200s postprandial Using Libre 2 meter; testing continuously   Patient denies hypoglycemic s/sx including dizziness, shakiness, sweating. Patient denies hyperglycemic symptoms including polyuria, polydipsia, polyphagia, nocturia, neuropathy, blurred vision.  Current meal patterns: to be discussed at future visits   Current medication access support: pending   Objective:  Lab Results  Component Value Date   HGBA1C 5.8  (A) 12/19/2022    Lab Results  Component Value Date   CREATININE 1.43 (H) 01/16/2023   BUN 23 01/16/2023   NA 138 01/16/2023   K 5.0 01/16/2023   CL 103 01/16/2023   CO2 25 01/16/2023    Lab Results  Component Value Date   CHOL 195 09/11/2022   HDL 42 09/11/2022   LDLCALC 122 (H) 09/11/2022   TRIG 184 (H) 09/11/2022   CHOLHDL 4.6 09/11/2022    Medications Reviewed Today     Reviewed by Gabriel Carina, RPH (Pharmacist) on 02/18/23 at 1754  Med List Status: <None>   Medication Order Taking? Sig Documenting Provider Last Dose Status Informant  amLODipine (NORVASC) 10 MG tablet [id number] No Take 1 tablet (10 mg total) by mouth daily. Lewayne Bunting, MD Taking Active   aspirin EC 81 MG tablet [id number] No Take 1 tablet (81 mg total) by mouth daily. Swallow whole. Lewayne Bunting, MD Taking Active   atorvastatin (LIPITOR) 80 MG tablet [id number] No Take 80 mg by mouth daily. [provider] Taking Active   buPROPion [facility] SR) 100 MG 12 hr tablet [id number] No Take 100 mg by mouth 2 (two) times daily. [provider] Taking Active   chlorthalidone (HYGROTON) 25 MG tablet [id number]  TAKE 2 TABLETS BY MOUTH DAILY Charlton Amor, DO  Active   Continuous Blood Gluc Receiver (FREESTYLE LIBRE 14 DAY READER) DEVI [id number] No by Does not apply route. [provider] Taking Active   Continuous Blood Gluc Sensor (FREESTYLE LIBRE 2 SENSOR) [place] [id number] No PLACE ON SKIN AND CHANGE EVERY  14 DAYS Charlton Amor, [place] Taking Active   fenofibrate 160 MG tablet [id number] No  [provider] Taking Active   furosemide (LASIX) 20 MG tablet [id number] No TAKE 1 TABLET BY MOUTH DAILY Everrett Coombe, DO Taking Active   gabapentin (NEURONTIN) 300 MG capsule [id number]  TAKE 1 CAPSULE BY MOUTH 3 TIMES  DAILY Wachs, Erika S, DO  Active   glipiZIDE (GLUCOTROL XL) 10 MG 24 hr tablet [id number] No Take 10 mg by mouth 2 (two) times daily. [provider] Taking  Active   glucose blood test strip [id number] No 1 each by Other route as needed for other. Use as instructed [provider] Taking Active   HUMALOG KWIKPEN 100 UNIT/ML KwikPen [id number] No Inject 30 units with each meal. Tamera Punt, Erika S, DO Taking Active   INSULIN SYRINGE .5CC/29G (B-D INS SYR ULTRAFINE .5CC/29G) 29G X 1/2" 0.5 ML MISC [id number] No by Does not apply route. [provider] Taking Active   lisinopril (ZESTRIL) 40 MG tablet [id number] No TAKE 1 TABLET BY MOUTH DAILY Charlton Amor, DO Taking Active   meloxicam (MOBIC) 15 MG tablet [id number]  TAKE 1 TABLET BY MOUTH DAILY Wachs, Erika S, DO  Active   metFORMIN (GLUCOPHAGE) 1000 MG tablet [id number] No Take 1,000 mg by mouth daily with breakfast. [provider] Taking Active   prazosin (MINIPRESS) 1 MG capsule [id number]  TAKE 1 CAPSULE BY MOUTH TWICE  DAILY Wachs, Erika S, DO  Active   Semaglutide,0.25 or 0.5MG /DOS, (OZEMPIC, 0.25 OR 0.5 MG/DOSE,) 2 MG/3ML SOPN [id number] No Inject 0.25 mg into the skin once a week. Charlton Amor, DO Taking Active   spironolactone (ALDACTONE) 25 MG tablet [id number] No Take 1 tablet (25 mg total) by mouth daily. Charlton Amor, DO Taking Active   tamsulosin (FLOMAX) 0.4 MG CAPS capsule [id number] No TAKE 1 CAPSULE BY MOUTH DAILY Everrett Coombe, DO Taking Active   TOUJEO SOLOSTAR 300 UNIT/ML Solostar Pen [id number] No 60 Units. [provider] Taking Active               Assessment/Plan:   Diabetes: - Currently controlled, though patient and provider desire GLP1 therapy for added benefit of CV risk reduction and weight loss. [employer] patient assistance application is pending, had missing information and was resolved on 02/16/23. Will await updates from manufacturer. - Reviewed goal A1c, goal fasting, and goal 2 hour post prandial glucose - Recommend to continue current regimen for now, continue using ozempic weekly (sample provided by PCP at recent office visit),  arranged for additional sample to be picked up by patient and can increase to 0.5mg  weekly dosing.  When novonordisk application is approved, anticipate switching the following to reduce medication costs:   ----> ozempic 1mg  weekly (after completing 2-4 weeks of ozempic at 0.5mg  weekly dosing)  -----> toujeo switch to tresiba (to obtain through same program)  -----> humalog switch to novolog (to obtain through same program)  -----> pen needles  - Recommend to check glucose using CGM as current practice - Meets financial criteria for ozempic, tresiba, novolog, and pen needles patient assistance program through [employer]. Will collaborate with provider, CPhT, and patient to pursue assistance.     Follow Up Plan: 2-4 weeks or sooner if patient assistance program has updates  Lynnda Shields, PharmD, BCPS Clinical Pharmacist [facility]

## 2023-02-25 NOTE — Telephone Encounter (Signed)
Received notification from [employer] regarding approval for OZEMPIC ,TRESIBA FLEXTOUCH  AND PEN NEEDLES . Patient assistance approved from 02/24/2023 to 10/06/2023. LETTER OF APPROVAL IN MEDIA OF CHART   Phone: [phone] Melanee Spry CPhT Rx Patient Advocate (O) [phone]

## 2023-02-26 ENCOUNTER — Ambulatory Visit (INDEPENDENT_AMBULATORY_CARE_PROVIDER_SITE_OTHER): Payer: Medicare Other | Admitting: Family Medicine

## 2023-02-26 VITALS — BP 136/79 | HR 100 | Ht 73.0 in | Wt 323.0 lb

## 2023-02-26 DIAGNOSIS — E1165 Type 2 diabetes mellitus with hyperglycemia: Secondary | ICD-10-CM

## 2023-02-26 DIAGNOSIS — N1831 Chronic kidney disease, stage 3a: Secondary | ICD-10-CM | POA: Diagnosis not present

## 2023-02-26 DIAGNOSIS — Z794 Long term (current) use of insulin: Secondary | ICD-10-CM

## 2023-02-26 NOTE — Progress Notes (Signed)
Established patient visit   Patient: Danny Montoya   DOB: 13-Mar-1951   72 y.o. Male  MRN: [id number] Visit Date: 02/26/2023  Today's healthcare provider: Charlton Amor, DO   Chief Complaint  Patient presents with   Follow-up    SUBJECTIVE    Chief Complaint  Patient presents with   Follow-up   HPI  Pt presents for T2DM follow up.   - Novolog 20-25u with meals - glipizide 10mg  BID - metformin 1000mg  - Tresiba 50u in am  - ozempic 0.25mg  - undergoing patient assistance  - last A1C two months ago was 5.8; he had someone come to his house and they did a poc A1c and it was 8.5 - he has gained about 11lbs since last visit  - diet is unhealthy: eats large portions of food  Alcohol use: Makes a mixed drink nightly of diet cherry dr pepper with 4 shots of alcohol. He denies every needing a drink in the morning to wake up and says he doesn't get a buzz from this. He does say it helps him sleep.   Review of Systems  Constitutional:  Negative for activity change, fatigue and fever.  Respiratory:  Negative for cough and shortness of breath.   Cardiovascular:  Negative for chest pain.  Gastrointestinal:  Negative for abdominal pain.  Genitourinary:  Negative for difficulty urinating.       Current Meds  Medication Sig   amLODipine (NORVASC) 10 MG tablet Take 1 tablet (10 mg total) by mouth daily.   aspirin EC 81 MG tablet Take 1 tablet (81 mg total) by mouth daily. Swallow whole.   atorvastatin (LIPITOR) 80 MG tablet Take 80 mg by mouth daily.   buPROPion (WELLBUTRIN SR) 100 MG 12 hr tablet Take 100 mg by mouth 2 (two) times daily.   chlorthalidone (HYGROTON) 25 MG tablet TAKE 2 TABLETS BY MOUTH DAILY   Continuous Blood Gluc Receiver (FREESTYLE LIBRE 14 DAY READER) DEVI by Does not apply route.   Continuous Blood Gluc Sensor (FREESTYLE LIBRE 2 SENSOR) MISC PLACE ON SKIN AND CHANGE EVERY  14 DAYS   fenofibrate 160 MG tablet    furosemide (LASIX) 20 MG tablet TAKE 1 TABLET BY  MOUTH DAILY   gabapentin (NEURONTIN) 300 MG capsule TAKE 1 CAPSULE BY MOUTH 3 TIMES  DAILY   glipiZIDE (GLUCOTROL XL) 10 MG 24 hr tablet Take 10 mg by mouth 2 (two) times daily.   glucose blood test strip 1 each by Other route as needed for other. Use as instructed   HUMALOG KWIKPEN 100 UNIT/ML KwikPen Inject 30 units with each meal.   INSULIN SYRINGE .5CC/29G (B-D INS SYR ULTRAFINE .5CC/29G) 29G X 1/2" 0.5 ML MISC by Does not apply route.   lisinopril (ZESTRIL) 40 MG tablet TAKE 1 TABLET BY MOUTH DAILY   meloxicam (MOBIC) 15 MG tablet TAKE 1 TABLET BY MOUTH DAILY   metFORMIN (GLUCOPHAGE) 1000 MG tablet Take 1,000 mg by mouth daily with breakfast.   prazosin (MINIPRESS) 1 MG capsule TAKE 1 CAPSULE BY MOUTH TWICE  DAILY   Semaglutide,0.25 or 0.5MG /DOS, (OZEMPIC, 0.25 OR 0.5 MG/DOSE,) 2 MG/3ML SOPN Inject 0.25 mg into the skin once a week.   spironolactone (ALDACTONE) 25 MG tablet Take 1 tablet (25 mg total) by mouth daily.   tamsulosin (FLOMAX) 0.4 MG CAPS capsule TAKE 1 CAPSULE BY MOUTH DAILY   TOUJEO SOLOSTAR 300 UNIT/ML Solostar Pen 60 Units.    OBJECTIVE    BP 136/79  Pulse 100   Ht 6\' 1"  (1.854 m)   Wt (!) 323 lb (146.5 kg)   SpO2 99%   BMI 42.61 kg/m   Physical Exam Vitals and nursing note reviewed.  Constitutional:      General: He is not in acute distress.    Appearance: Normal appearance.  HENT:     Head: Normocephalic and atraumatic.     Right Ear: External ear normal.     Left Ear: External ear normal.     Nose: Nose normal.  Eyes:     Conjunctiva/sclera: Conjunctivae normal.  Cardiovascular:     Rate and Rhythm: Normal rate and regular rhythm.  Pulmonary:     Effort: Pulmonary effort is normal.     Breath sounds: Normal breath sounds.  Neurological:     General: No focal deficit present.     Mental Status: He is alert and oriented to person, place, and time.  Psychiatric:        Mood and Affect: Mood normal.        Behavior: Behavior normal.        Thought  Content: Thought content normal.        Judgment: Judgment normal.          ASSESSMENT & PLAN    Problem List Items Addressed This Visit       Endocrine   Type 2 diabetes mellitus with hyperglycemia, with long-term current use of insulin (HCC) - Primary    - pt is on patient assistance. Recommend increasing ozempic to 1mg  for four weeks and then 2mg   - I would like to get him off glipizide at next visit if possible because I don't think it's doing much for sugar control and not helping with our goal of weight loss - had a long discussion about food intake and relationship with food. Pt says that he was told to finish his plate when he was a kid and he is just accustomed to this. We talked about portion control and trying to eat healthier foods. Did discuss decreasing alcohol intake         Genitourinary   Chronic kidney disease, stage 3a (HCC)   Relevant Orders   COMPLETE METABOLIC PANEL WITH GFR   Microalbumin, urine    Return in about 2 months (around 04/28/2023).      No orders of the defined types were placed in this encounter.   Orders Placed This Encounter  Procedures   COMPLETE METABOLIC PANEL WITH GFR   Microalbumin, urine     Charlton Amor, DO  [facility] at [facility] [phone] (phone) [phone] (fax)  [facility]

## 2023-02-26 NOTE — Assessment & Plan Note (Signed)
-   pt is on patient assistance. Recommend increasing ozempic to 1mg  for four weeks and then 2mg   - I would like to get him off glipizide at next visit if possible because I don't think it's doing much for sugar control and not helping with our goal of weight loss - had a long discussion about food intake and relationship with food. Pt says that he was told to finish his plate when he was a kid and he is just accustomed to this. We talked about portion control and trying to eat healthier foods. Did discuss decreasing alcohol intake

## 2023-02-27 LAB — MICROALBUMIN, URINE: Microalb, Ur: 2.5 mg/dL

## 2023-03-04 DIAGNOSIS — G4733 Obstructive sleep apnea (adult) (pediatric): Secondary | ICD-10-CM | POA: Diagnosis not present

## 2023-03-05 ENCOUNTER — Other Ambulatory Visit: Payer: Self-pay | Admitting: Cardiology

## 2023-03-05 DIAGNOSIS — I10 Essential (primary) hypertension: Secondary | ICD-10-CM

## 2023-03-11 NOTE — Telephone Encounter (Addendum)
Received notification from [employer] regarding approval for NOVOLOG FLEXPEN.   Patient assistance approved from 03/10/2023 to 10/06/2023  Phone: [phone]

## 2023-03-17 ENCOUNTER — Telehealth: Payer: Self-pay

## 2023-03-17 NOTE — Telephone Encounter (Addendum)
Forwarding to [place] and provider as an Financial planner.   [employer] PAP shipment for [place] 100 u dose (3 boxes), Novolog Flexpen 100 u dose (8 boxes), &  Novofine 32g tip needles ( 5 boxes) were received this morning. Contacted the patient. No answer. Left a detailed vm msg to come and pick up their order today. Placed in the fridge with patient identifier. Thanks in advance.   Evaristo BuryKarrie Doffing: [id number] LOT: [id number] EXP: 2025-03-05  Novolog Flexpen: [id number] LOT: [id number] EXP: 2025-05-05  NovoFine 32g: LIST: [id number] LOT: [id number] EXP: 2027-10-06

## 2023-03-18 ENCOUNTER — Telehealth: Payer: Self-pay

## 2023-03-18 ENCOUNTER — Telehealth: Payer: Self-pay | Admitting: Family Medicine

## 2023-03-18 DIAGNOSIS — E1165 Type 2 diabetes mellitus with hyperglycemia: Secondary | ICD-10-CM | POA: Diagnosis not present

## 2023-03-18 DIAGNOSIS — Z794 Long term (current) use of insulin: Secondary | ICD-10-CM | POA: Diagnosis not present

## 2023-03-18 NOTE — Telephone Encounter (Signed)
Can you please reach out to pharmacist about quantity of medical supplies issued to patient. There was a discrepancy when patient picked up meds and supplies. Pt was supposed to receive 4 month supply. He only received 1 month.

## 2023-03-18 NOTE — Progress Notes (Addendum)
   03/18/2023  Patient ID: Danny Montoya, male   DOB: June 10, 1951, 72 y.o.   MRN: [id number]  Patient outreach to discuss the following: Tresiba, Novolog and Pen needles at PCP office- changing from current regimen of Toujeo and Humalog Ozempic 1mg  being processed and should arrive at PCP office in the next 7-14 business days   Unable to make contact, so I left a voicemail with my direct phone number and will try to contact again in 2-3 days if I do not hear back.  [name], PharmD, DPLA

## 2023-03-19 ENCOUNTER — Telehealth: Payer: Self-pay

## 2023-03-19 LAB — BASIC METABOLIC PANEL
BUN/Creatinine Ratio: 19 (calc) (ref 6–22)
BUN: 33 mg/dL — ABNORMAL HIGH (ref 7–25)
CO2: 26 mmol/L (ref 20–32)
Calcium: 10.5 mg/dL — ABNORMAL HIGH (ref 8.6–10.3)
Chloride: 101 mmol/L (ref 98–110)
Creat: 1.76 mg/dL — ABNORMAL HIGH (ref 0.70–1.35)
Glucose, Bld: 198 mg/dL — ABNORMAL HIGH (ref 65–99)
Potassium: 4.7 mmol/L (ref 3.5–5.3)
Sodium: 137 mmol/L (ref 135–146)

## 2023-03-19 NOTE — Progress Notes (Signed)
   03/19/2023  Patient ID: Danny Montoya, male   DOB: 1950-12-10, 72 y.o.   MRN: [id number]  Patient returning my call regarding message I left about PAP supplied [name], Novolog and pen needles arriving at PCP office. I also received a message from [name] stating the patient was expecting more medication that what he received.  Patient picked up expected amount of medications shipped to [facility], but he was also expecting Ozempic 1mg .  He has been taking 0.5mg  weekly and was due for his dose today, but he is out of medication.  Contacted [employer] to check on status of Ozempic 1mg , and is in the process of being shipped.  This could take another 10-14 days to arrive at PCP office.  Contacting PCP office to see if they have additional sample patient can get until shipment is received.  Will contact patient to inform.  [name], PharmD, DPLA

## 2023-03-20 ENCOUNTER — Telehealth: Payer: Self-pay

## 2023-03-20 NOTE — Progress Notes (Signed)
   03/20/2023  Patient ID: Danny Montoya, male   DOB: 28-Apr-1951, 72 y.o.   MRN: [id number]  Patient outreach to inform [name] that his Ozempic through [employer] is still processing and can take another 10-14 days to arrive at [facility].  In the mean time, they have set aside a 0.25/0.5mg  sample for him to pick up.  I instructed him to take 2 injections of the 0.5mg  to equal 1mg .  This will give him 2 more weeks/doses of medication.  Will check with [employer] next Friday for a shipping update on his PAP supply.  [name], PharmD, DPLA

## 2023-03-21 DIAGNOSIS — G4733 Obstructive sleep apnea (adult) (pediatric): Secondary | ICD-10-CM | POA: Diagnosis not present

## 2023-03-23 NOTE — Telephone Encounter (Signed)
Patient came into office today 03/23/23 to get box of OZEMPIC, thanks.

## 2023-03-24 ENCOUNTER — Other Ambulatory Visit: Payer: Self-pay | Admitting: Cardiology

## 2023-03-24 DIAGNOSIS — I10 Essential (primary) hypertension: Secondary | ICD-10-CM

## 2023-03-25 ENCOUNTER — Telehealth: Payer: Self-pay

## 2023-03-25 NOTE — Telephone Encounter (Signed)
Forwarding to [place] as an Financial planner.  Garment/textile technologist)  [employer] PAP shipment for [employer] 1 mg dose (4 boxes) received this morning. Please contact the patient to come and pick up their order today. Placed in the fridge with patient identifier. Thanks in advance.   NDC: [id number] LOT: [id number] EXP: 2025-09-04

## 2023-03-25 NOTE — Telephone Encounter (Signed)
LM for pt to return call or come into the office to pick up medication. Roselyn Reef, CMA

## 2023-04-06 ENCOUNTER — Encounter: Payer: Self-pay | Admitting: Family Medicine

## 2023-04-16 ENCOUNTER — Ambulatory Visit (INDEPENDENT_AMBULATORY_CARE_PROVIDER_SITE_OTHER): Payer: Medicare Other

## 2023-04-16 ENCOUNTER — Ambulatory Visit (INDEPENDENT_AMBULATORY_CARE_PROVIDER_SITE_OTHER): Payer: Medicare Other | Admitting: Sports Medicine

## 2023-04-16 ENCOUNTER — Other Ambulatory Visit (INDEPENDENT_AMBULATORY_CARE_PROVIDER_SITE_OTHER): Payer: Medicare Other

## 2023-04-16 DIAGNOSIS — M19011 Primary osteoarthritis, right shoulder: Secondary | ICD-10-CM

## 2023-04-16 DIAGNOSIS — M25462 Effusion, left knee: Secondary | ICD-10-CM | POA: Diagnosis not present

## 2023-04-16 DIAGNOSIS — M170 Bilateral primary osteoarthritis of knee: Secondary | ICD-10-CM

## 2023-04-16 DIAGNOSIS — Z6841 Body Mass Index (BMI) 40.0 and over, adult: Secondary | ICD-10-CM

## 2023-04-16 DIAGNOSIS — E66813 Obesity, class 3: Secondary | ICD-10-CM

## 2023-04-16 DIAGNOSIS — M25461 Effusion, right knee: Secondary | ICD-10-CM | POA: Diagnosis not present

## 2023-04-16 MED ORDER — CELECOXIB 100 MG PO CAPS
100.0000 mg | ORAL_CAPSULE | Freq: Every day | ORAL | 3 refills | Status: DC
Start: 2023-04-16 — End: 2023-07-26

## 2023-04-16 NOTE — Assessment & Plan Note (Signed)
I am seeing this pleasant 72 year old male for the first time, he has severe end-stage knee osteoarthritis with varus deformity. He has had several injections in the past, the last of which was in March 2024, he does not typically get 3 months of relief. He is requesting injections today, I did bilateral aspirations and injections, I would like updated x-rays, I do not think viscosupplementation will do him much good at this stage, his BMI is too high for arthroplasty, we will add Celebrex and I would like him to discuss genicular artery embolization with interventional radiology. In addition I would also like he and his primary care provider to consider referral for bariatric surgery as he is not losing sufficient weight with diet, exercise, or Ozempic.

## 2023-04-16 NOTE — Assessment & Plan Note (Signed)
Danny Montoya also has severe shoulder pain, right-sided with significant loss of motion, severe osteoarthritis on x-rays. He has had several injections in the past that do not provide 3 months of relief, he has tried physical therapy in the past. Adding Celebrex, low-dose, he does have some renal insufficiency. I would like him to consult with Dr. Everardo Pacific to discuss shoulder arthroplasty at this point.

## 2023-04-16 NOTE — Progress Notes (Signed)
    Procedures performed today:    Procedure: Real-time Ultrasound Guided aspiration/injection of right knee Device: Samsung HS60  Verbal informed consent obtained.  Time-out conducted.  Noted no overlying erythema, induration, or other signs of local infection.  Skin prepped in a sterile fashion.  Local anesthesia: Topical Ethyl chloride.  With sterile technique and under real time ultrasound guidance: Noted effusion, aspirated 25 mL of clear, straw-colored fluid, syringe switched and 1 cc Kenalog 40, 2 cc lidocaine, 2 cc bupivacaine injected easily Completed without difficulty  Advised to call if fevers/chills, erythema, induration, drainage, or persistent bleeding.  Images permanently stored and available for review in PACS.  Impression: Technically successful ultrasound guided injection.  Procedure: Real-time Ultrasound Guided aspiration/injection of left knee Device: Samsung HS60  Verbal informed consent obtained.  Time-out conducted.  Noted no overlying erythema, induration, or other signs of local infection.  Skin prepped in a sterile fashion.  Local anesthesia: Topical Ethyl chloride.  With sterile technique and under real time ultrasound guidance: Noted effusion, aspirated 15 mL of clear, straw-colored fluid, syringe switched and 1 cc Kenalog 40, 2 cc lidocaine, 2 cc bupivacaine injected easily Completed without difficulty  Advised to call if fevers/chills, erythema, induration, drainage, or persistent bleeding.  Images permanently stored and available for review in PACS.  Impression: Technically successful ultrasound guided injection.  Independent interpretation of notes and tests performed by another provider:   None.  Brief History, Exam, Impression, and Recommendations:    Primary osteoarthritis of both knees I am seeing this pleasant 72 year old male for the first time, he has severe end-stage knee osteoarthritis with varus deformity. He has had several injections  in the past, the last of which was in March 2024, he does not typically get 3 months of relief. He is requesting injections today, I did bilateral aspirations and injections, I would like updated x-rays, I do not think viscosupplementation will do him much good at this stage, his BMI is too high for arthroplasty, we will add Celebrex and I would like him to discuss genicular artery embolization with interventional radiology. In addition I would also like he and his primary care provider to consider referral for bariatric surgery as he is not losing sufficient weight with diet, exercise, or Ozempic.  Primary osteoarthritis, right shoulder Carole also has severe shoulder pain, right-sided with significant loss of motion, severe osteoarthritis on x-rays. He has had several injections in the past that do not provide 3 months of relief, he has tried physical therapy in the past. Adding Celebrex, low-dose, he does have some renal insufficiency. I would like him to consult with Dr. Everardo Pacific to discuss shoulder arthroplasty at this point.  Class 3 severe obesity due to excess calories without serious comorbidity with body mass index (BMI) of 40.0 to 44.9 in adult [facility]) Morbid obesity, multiple comorbidities, diet, exercise, medication not effective. He needs to lose 150ish pounds, I do not think this is going to happen with continued conservative treatment, I have asked him to consider bariatric surgery, he will discuss this with his PCP. This could potentially cure his diabetes, sleep apnea.    ____________________________________________ Ihor Austin. Benjamin Stain, M.D., ABFM., CAQSM., AME. Primary Care and Sports Medicine [facility]  Adjunct Professor of Family Medicine  [employer]  Restaurant manager, fast food

## 2023-04-16 NOTE — Assessment & Plan Note (Signed)
Morbid obesity, multiple comorbidities, diet, exercise, medication not effective. He needs to lose 150ish pounds, I do not think this is going to happen with continued conservative treatment, I have asked him to consider bariatric surgery, he will discuss this with his PCP. This could potentially cure his diabetes, sleep apnea.

## 2023-04-20 DIAGNOSIS — G4733 Obstructive sleep apnea (adult) (pediatric): Secondary | ICD-10-CM | POA: Diagnosis not present

## 2023-04-22 DIAGNOSIS — G4733 Obstructive sleep apnea (adult) (pediatric): Secondary | ICD-10-CM | POA: Diagnosis not present

## 2023-04-28 ENCOUNTER — Ambulatory Visit: Payer: Medicare Other | Admitting: Family Medicine

## 2023-04-28 ENCOUNTER — Encounter: Payer: Self-pay | Admitting: Sports Medicine

## 2023-04-28 DIAGNOSIS — M170 Bilateral primary osteoarthritis of knee: Secondary | ICD-10-CM | POA: Diagnosis not present

## 2023-05-02 NOTE — Progress Notes (Signed)
Chief Complaint: Patient was seen in virtual consultation today for bilateral knee pain  Referring Physician(s): Thekkekandam,Thomas J  History of Present Illness: Danny Montoya is a 72 y.o. male with a medical history significant for DM, HTN, obstructive sleep apnea, obesity, right shoulder and bilateral knee osteoarthritis. He was recently referred to orthopedics for severe end-stage bilateral knee osteoarthritis with varus deformity. He has had several injections in the past and these have provided a few month's of relief. He was evaluated by Dr. Benjamin Stain 04/16/23 who aspirated both knees and also injected both knees with steroids. Dr. Roderic Palau did not think viscosupplementation would be effective for the patient at this stage and the patient's BMI is too high for arthroplasty. He has kindly referred the patient to Interventional Radiology to discuss genicular artery embolization.   The patient presents today via virtual telephone visit to discuss this procedure.  Past Medical History:  Diagnosis Date   Diabetes mellitus (HCC)    Gastroesophageal reflux disease    Hyperlipidemia    Hypertension    Obstructive sleep apnea     Past Surgical History:  Procedure Laterality Date   NECK SURGERY      Allergies: Penicillins  Medications: Prior to Admission medications   Medication Sig Start Date End Date Taking? Authorizing Provider  amLODipine (NORVASC) 10 MG tablet Take 1 tablet (10 mg total) by mouth daily. Patient needs appointment for future refills 03/06/23   Lewayne Bunting, MD  aspirin EC 81 MG tablet Take 1 tablet (81 mg total) by mouth daily. Swallow whole. 07/20/20   Lewayne Bunting, MD  atorvastatin (LIPITOR) 80 MG tablet Take 80 mg by mouth daily. 01/27/20   [provider]  buPROPion (WELLBUTRIN SR) 100 MG 12 hr tablet Take 100 mg by mouth 2 (two) times daily. 01/19/20   [provider]  celecoxib (CELEBREX) 100 MG capsule Take 1 capsule (100 mg  total) by mouth daily. 04/16/23   Monica Becton, MD  chlorthalidone (HYGROTON) 25 MG tablet TAKE 2 TABLETS BY MOUTH DAILY 01/20/23   Charlton Amor, DO  Continuous Blood Gluc Receiver (FREESTYLE LIBRE 14 DAY READER) DEVI by Does not apply route.    [provider]  Continuous Blood Gluc Sensor (FREESTYLE LIBRE 2 SENSOR) MISC PLACE ON SKIN AND CHANGE EVERY  14 DAYS 11/20/22   Charlton Amor, DO  fenofibrate 160 MG tablet  01/27/20   [provider]  furosemide (LASIX) 20 MG tablet TAKE 1 TABLET BY MOUTH DAILY 10/29/22   Everrett Coombe, DO  gabapentin (NEURONTIN) 300 MG capsule TAKE 1 CAPSULE BY MOUTH 3 TIMES  DAILY 01/28/23   Morey Hummingbird S, DO  glipiZIDE (GLUCOTROL XL) 10 MG 24 hr tablet Take 10 mg by mouth 2 (two) times daily. 12/23/19   [provider]  glucose blood test strip 1 each by Other route as needed for other. Use as instructed    [provider]  HUMALOG KWIKPEN 100 UNIT/ML KwikPen Inject 30 units with each meal. 01/16/23   Tamera Punt, Colbert Coyer, DO  INSULIN SYRINGE .5CC/29G (B-D INS SYR ULTRAFINE .5CC/29G) 29G X 1/2" 0.5 ML MISC by Does not apply route.    [provider]  lisinopril (ZESTRIL) 40 MG tablet TAKE 1 TABLET BY MOUTH DAILY 01/05/23   Charlton Amor, DO  metFORMIN (GLUCOPHAGE) 1000 MG tablet Take 1,000 mg by mouth daily with breakfast. 12/23/19   [provider]  prazosin (MINIPRESS) 1 MG capsule TAKE 1 CAPSULE BY MOUTH  TWICE  DAILY 01/23/23   Charlton Amor, DO  Semaglutide,0.25 or 0.5MG /DOS, (OZEMPIC, 0.25 OR 0.5 MG/DOSE,) 2 MG/3ML SOPN Inject 0.25 mg into the skin once a week. 01/16/23   Charlton Amor, DO  spironolactone (ALDACTONE) 25 MG tablet Take 1 tablet (25 mg total) by mouth daily. 12/19/22   Charlton Amor, DO  tamsulosin (FLOMAX) 0.4 MG CAPS capsule TAKE 1 CAPSULE BY MOUTH DAILY 02/22/23   Wachs, Erika S, DO  TOUJEO SOLOSTAR 300 UNIT/ML Solostar Pen 60 Units. 01/19/20   [provider]     Family History   Problem Relation Age of Onset   Coronary artery disease Mother    Coronary artery disease Sister     Social History   Socioeconomic History   Marital status: Single    Spouse name: Not on file   Number of children: Not on file   Years of education: Not on file   Highest education level: 12th grade  Occupational History   Not on file  Tobacco Use   Smoking status: Never   Smokeless tobacco: Never  Substance and Sexual Activity   Alcohol use: Yes   Drug use: Not on file   Sexual activity: Not on file  Other Topics Concern   Not on file  Social History Narrative   Not on file   Social Determinants of Health   Financial Resource Strain: Low Risk  (02/09/2023)   Overall Financial Resource Strain (CARDIA)    Difficulty of Paying Living Expenses: Not very hard  Food Insecurity: No Food Insecurity (02/09/2023)   Hunger Vital Sign    Worried About Running Out of Food in the Last Year: Never true    Ran Out of Food in the Last Year: Never true  Transportation Needs: No Transportation Needs (02/09/2023)   PRAPARE - Administrator, Civil Service (Medical): No    Lack of Transportation (Non-Medical): No  Physical Activity: Insufficiently Active (02/09/2023)   Exercise Vital Sign    Days of Exercise per Week: 1 day    Minutes of Exercise per Session: 10 min  Stress: No Stress Concern Present (02/09/2023)   [employer] of Occupational Health - Occupational Stress Questionnaire    Feeling of Stress : Only a little  Social Connections: Socially Isolated (02/09/2023)   Social Connection and Isolation Panel [NHANES]    Frequency of Communication with Friends and Family: Never    Frequency of Social Gatherings with Friends and Family: Never    Attends Religious Services: Never    Database administrator or Organizations: No    Attends Engineer, structural: Not on file    Marital Status: Divorced    Review of Systems: A 12 point ROS discussed and pertinent positives  are indicated in the HPI above.  All other systems are negative.  Vital Signs: There were no vitals taken for this visit.  Advance Care Plan: The advanced care plan/surrogate decision maker was discussed at the time of visit and documented in the medical record.   No physical exam was performed in lieu of virtual telephone visit.   Imaging: DG Knee Complete 4 Views Left  Result Date: 04/21/2023 CLINICAL DATA:  Arthritis. EXAM: LEFT KNEE - COMPLETE 4 VIEW; RIGHT KNEE - COMPLETE 4 VIEW COMPARISON:  None Available. FINDINGS: No evidence of fracture, dislocation, or joint effusion. There is tricompartmental joint space narrowing, sclerosis and osteophytes consistent with degenerative joint disease. Bilateral lateral meniscal calcifications consistent with chondrocalcinosis. Small  bilateral knee joint effusions. IMPRESSION: Degenerative changes. Small effusions. Chondrocalcinosis. No acute osseous abnormalities. Electronically Signed   By: Layla Maw M.D.   On: 04/21/2023 22:30   DG Knee Complete 4 Views Right  Result Date: 04/21/2023 CLINICAL DATA:  Arthritis. EXAM: LEFT KNEE - COMPLETE 4 VIEW; RIGHT KNEE - COMPLETE 4 VIEW COMPARISON:  None Available. FINDINGS: No evidence of fracture, dislocation, or joint effusion. There is tricompartmental joint space narrowing, sclerosis and osteophytes consistent with degenerative joint disease. Bilateral lateral meniscal calcifications consistent with chondrocalcinosis. Small bilateral knee joint effusions. IMPRESSION: Degenerative changes. Small effusions. Chondrocalcinosis. No acute osseous abnormalities. Electronically Signed   By: Layla Maw M.D.   On: 04/21/2023 22:30   [place] LIMITED JOINT SPACE STRUCTURES LOW BILAT  Result Date: 04/16/2023 Procedure: Real-time Ultrasound Guided aspiration/injection of right knee Device: Samsung [id number] Verbal informed consent obtained. Time-out conducted. Noted no overlying erythema, induration, or other signs of  local infection. Skin prepped in a sterile fashion. Local anesthesia: Topical Ethyl chloride. With sterile technique and under real time ultrasound guidance: Noted effusion, aspirated 25 mL of clear, straw-colored fluid, syringe switched and 1 cc Kenalog 40, 2 cc lidocaine, 2 cc bupivacaine injected easily Completed without difficulty Advised to call if fevers/chills, erythema, induration, drainage, or persistent bleeding. Images permanently stored and available for review in PACS. Impression: Technically successful ultrasound guided injection. Procedure: Real-time Ultrasound Guided aspiration/injection of left knee Device: Samsung [id number] Verbal informed consent obtained. Time-out conducted. Noted no overlying erythema, induration, or other signs of local infection. Skin prepped in a sterile fashion. Local anesthesia: Topical Ethyl chloride. With sterile technique and under real time ultrasound guidance: Noted effusion, aspirated 15 mL of clear, straw-colored fluid, syringe switched and 1 cc Kenalog 40, 2 cc lidocaine, 2 cc bupivacaine injected easily Completed without difficulty Advised to call if fevers/chills, erythema, induration, drainage, or persistent bleeding. Images permanently stored and available for review in PACS. Impression: Technically successful ultrasound guided injection.    Labs:  CBC: Recent Labs    09/11/22 1053  WBC 7.0  HGB 13.8  HCT 40.5  PLT 289    COAGS: No results for input(s): "INR", "APTT" in the last 8760 hours.  BMP: Recent Labs    09/11/22 1053 12/19/22 1154 01/16/23 1203 03/18/23 1435  NA 140 138 138 137  K 4.2 4.8 5.0 4.7  CL 105 102 103 101  CO2 24 26 25 26   GLUCOSE 168* 262* 183* 198*  BUN 31* 33* 23 33*  CALCIUM 10.4* 11.4* 10.9* 10.5*  CREATININE 1.31 1.23 1.43* 1.76*    LIVER FUNCTION TESTS: Recent Labs    09/11/22 1053  BILITOT 0.5  AST 18  ALT 19  PROT 6.9    TUMOR MARKERS: No results for input(s): "AFPTM", "CEA", "CA199",  "CHROMGRNA" in the last 8760 hours.  Assessment and Plan:  72 year old male with a medical history significant for bilateral knee pain.   Thank you for this interesting consult.  I greatly enjoyed meeting Danny Montoya and look forward to participating in their care.  A copy of this report was sent to the requesting provider on this date.  Electronically Signed: Mickie Kay, NP 05/02/2023, 1:02 PM   I spent a total of  40 Minutes   in virtual clinical consultation, greater than 50% of which was counseling/coordinating care for bilateral knee pain.

## 2023-05-04 ENCOUNTER — Other Ambulatory Visit: Payer: Self-pay | Admitting: Cardiology

## 2023-05-04 ENCOUNTER — Ambulatory Visit
Admission: RE | Admit: 2023-05-04 | Discharge: 2023-05-04 | Disposition: A | Discharge status: Home / Self Care | Payer: Medicare Other | Source: Ambulatory Visit | Attending: Sports Medicine | Admitting: Sports Medicine

## 2023-05-04 DIAGNOSIS — M170 Bilateral primary osteoarthritis of knee: Secondary | ICD-10-CM

## 2023-05-04 DIAGNOSIS — M25562 Pain in left knee: Secondary | ICD-10-CM | POA: Diagnosis not present

## 2023-05-04 DIAGNOSIS — I10 Essential (primary) hypertension: Secondary | ICD-10-CM

## 2023-05-04 DIAGNOSIS — M25561 Pain in right knee: Secondary | ICD-10-CM | POA: Diagnosis not present

## 2023-05-04 HISTORY — PX: IR RADIOLOGIST EVAL & MGMT: IMG5224

## 2023-05-05 MED ORDER — TRAMADOL HCL 50 MG PO TABS
50.0000 mg | ORAL_TABLET | Freq: Three times a day (TID) | ORAL | 0 refills | Status: DC | PRN
Start: 2023-05-05 — End: 2023-05-28

## 2023-05-05 NOTE — Telephone Encounter (Signed)
I spent 5 total minutes of online digital evaluation and management services in this patient-initiated request for online care. 

## 2023-05-07 ENCOUNTER — Other Ambulatory Visit (HOSPITAL_COMMUNITY): Payer: Self-pay | Admitting: Interventional Radiology

## 2023-05-07 DIAGNOSIS — M25562 Pain in left knee: Secondary | ICD-10-CM

## 2023-05-21 DIAGNOSIS — G4733 Obstructive sleep apnea (adult) (pediatric): Secondary | ICD-10-CM | POA: Diagnosis not present

## 2023-05-28 ENCOUNTER — Ambulatory Visit (INDEPENDENT_AMBULATORY_CARE_PROVIDER_SITE_OTHER): Payer: Medicare Other | Admitting: Sports Medicine

## 2023-05-28 DIAGNOSIS — F321 Major depressive disorder, single episode, moderate: Secondary | ICD-10-CM

## 2023-05-28 DIAGNOSIS — M19011 Primary osteoarthritis, right shoulder: Secondary | ICD-10-CM | POA: Diagnosis not present

## 2023-05-28 DIAGNOSIS — M170 Bilateral primary osteoarthritis of knee: Secondary | ICD-10-CM | POA: Diagnosis not present

## 2023-05-28 MED ORDER — BUPROPION HCL ER (XL) 150 MG PO TB24: 150.0000 mg | ORAL_TABLET | ORAL | 3 refills | Status: DC

## 2023-05-28 MED ORDER — HYDROCODONE-ACETAMINOPHEN 10-325 MG PO TABS: 1.0000 | ORAL_TABLET | Freq: Three times a day (TID) | ORAL | 0 refills | Status: DC | PRN

## 2023-05-28 NOTE — Assessment & Plan Note (Signed)
At this point Andris has failed steroid injections, analgesics, he did have a consult for genicular artery embolization. He plans to have this done soon. Tramadol ineffective so I am adding some hydrocodone for pain relief in the meantime.

## 2023-05-28 NOTE — Patient Instructions (Signed)
The procedure that will be done is called geniculate artery embolization.

## 2023-05-28 NOTE — Assessment & Plan Note (Signed)
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 No suicidal or homicidal ideation. We discussed the pathophysiology and monoamine hypothesis of depression, he is agreeable to start Wellbutrin as he does not have significant anxiety symptoms, he is also agreeable to try behavioral therapy. I would like him to follow-up with [name] in about 6 weeks.

## 2023-05-28 NOTE — Assessment & Plan Note (Signed)
Severe shoulder pain, significant loss of motion, severe osteoarthritis on x-rays, has failed Celebrex, several glenohumeral injections with ultrasound guidance. We did refer him to Dr. Everardo Pacific in [place] but he would prefer to stay in the area, we will set him up with shoulder surgery with [facility]

## 2023-05-28 NOTE — Progress Notes (Signed)
    Procedures performed today:    None.  Independent interpretation of notes and tests performed by another provider:   None.  Brief History, Exam, Impression, and Recommendations:    Primary osteoarthritis of both knees At this point Danny Montoya has failed steroid injections, analgesics, he did have a consult for genicular artery embolization. He plans to have this done soon. Tramadol ineffective so I am adding some hydrocodone for pain relief in the meantime.  Primary osteoarthritis, right shoulder Severe shoulder pain, significant loss of motion, severe osteoarthritis on x-rays, has failed Celebrex, several glenohumeral injections with ultrasound guidance. We did refer him to Dr. 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 No suicidal or homicidal ideation. We discussed the pathophysiology and monoamine hypothesis of depression, he is agreeable to start Wellbutrin as he does not have significant anxiety symptoms, he is also agreeable to try behavioral therapy. I would like him to follow-up with [name] in about 6 weeks.  I spent 40 minutes of total time managing this patient today, this includes chart review, face to face, and non-face to face time.  ____________________________________________ Danny Montoya. Benjamin Stain, M.D., ABFM., CAQSM., AME. Primary Care and Sports Medicine [facility]  Adjunct Professor of Family Medicine  [place] [employer]  Restaurant manager, fast food

## 2023-06-03 ENCOUNTER — Other Ambulatory Visit: Payer: Self-pay | Admitting: Cardiology

## 2023-06-03 DIAGNOSIS — I10 Essential (primary) hypertension: Secondary | ICD-10-CM

## 2023-06-16 ENCOUNTER — Other Ambulatory Visit (HOSPITAL_COMMUNITY): Payer: Medicare Other

## 2023-06-22 ENCOUNTER — Other Ambulatory Visit: Payer: Self-pay | Admitting: Physician Assistant

## 2023-06-22 DIAGNOSIS — Z01818 Encounter for other preprocedural examination: Secondary | ICD-10-CM

## 2023-06-22 NOTE — H&P (Signed)
Referring Physician(s): Monica Becton   Supervising Physician: Marliss Coots  Patient Status:  [facility] OP  Chief Complaint:  Bilateral knee pain  Subjective: Pt known to IR team from consultation with Dr. Elby Showers on 05/04/23 to discuss treatment options for persistent bilateral knee pain. He is a 72 yo male with PMH sig for DM, GERD, HLD, HTN, OSA . He also has advanced bilateral knee osteoarthritis (K&L III) with significant lifestyle limiting knee pain which is equal bilaterally (WOMAC 60/96, VAS 8/10). He has had multiple bilateral knee injections with minimal sustained relief, and is not a candidate for knee arthroplasty due to weight. Following d/w Dr. Elby Showers he was deemed an appropriate candidate for genicular artery embolization and presents today for left knee GAE initially followed later by rt knee GAE if pain improved. He denies fever,HA,CP,dyspnea, cough, abd pain,N/V or bleeding. He is anxious ,has peripheral neuropathy and back/bilateral knee pain.    Past Medical History:  Diagnosis Date   Diabetes mellitus (HCC)    Gastroesophageal reflux disease    Hyperlipidemia    Hypertension    Obstructive sleep apnea    Past Surgical History:  Procedure Laterality Date   IR RADIOLOGIST EVAL & MGMT  05/04/2023   NECK SURGERY        Allergies: Penicillins  Medications: Prior to Admission medications   Medication Sig Start Date End Date Taking? Authorizing Provider  amLODipine (NORVASC) 10 MG tablet TAKE 1 TABLET BY MOUTH DAILY 06/04/23   Lewayne Bunting, MD  aspirin EC 81 MG tablet Take 1 tablet (81 mg total) by mouth daily. Swallow whole. 07/20/20   Lewayne Bunting, MD  atorvastatin (LIPITOR) 80 MG tablet Take 80 mg by mouth daily. 01/27/20   [provider]  buPROPion (WELLBUTRIN XL) 150 MG 24 hr tablet Take 1 tablet (150 mg total) by mouth every morning. 05/28/23   Monica Becton, MD  celecoxib (CELEBREX) 100 MG capsule Take 1 capsule (100 mg  total) by mouth daily. 04/16/23   Monica Becton, MD  chlorthalidone (HYGROTON) 25 MG tablet TAKE 2 TABLETS BY MOUTH DAILY 01/20/23   Charlton Amor, DO  Continuous Blood Gluc Receiver (FREESTYLE LIBRE 14 DAY READER) DEVI by Does not apply route.    [provider]  Continuous Blood Gluc Sensor (FREESTYLE LIBRE 2 SENSOR) MISC PLACE ON SKIN AND CHANGE EVERY  14 DAYS 11/20/22   Charlton Amor, DO  fenofibrate 160 MG tablet  01/27/20   [provider]  furosemide (LASIX) 20 MG tablet TAKE 1 TABLET BY MOUTH DAILY 10/29/22   Everrett Coombe, DO  gabapentin (NEURONTIN) 300 MG capsule TAKE 1 CAPSULE BY MOUTH 3 TIMES  DAILY 01/28/23   Morey Hummingbird S, DO  glipiZIDE (GLUCOTROL XL) 10 MG 24 hr tablet Take 10 mg by mouth 2 (two) times daily. 12/23/19   [provider]  glucose blood test strip 1 each by Other route as needed for other. Use as instructed    [provider]  HUMALOG KWIKPEN 100 UNIT/ML KwikPen Inject 30 units with each meal. 01/16/23   Tamera Punt, Colbert Coyer, DO  HYDROcodone-acetaminophen (NORCO) 10-325 MG tablet Take 1 tablet by mouth every 8 (eight) hours as needed. 05/28/23   Monica Becton, MD  INSULIN SYRINGE .5CC/29G (B-D INS SYR ULTRAFINE .5CC/29G) 29G X 1/2" 0.5 ML MISC by Does not apply route.    [provider]  lisinopril (ZESTRIL) 40 MG tablet TAKE 1 TABLET BY MOUTH DAILY 01/05/23  Morey Hummingbird S, DO  metFORMIN (GLUCOPHAGE) 1000 MG tablet Take 1,000 mg by mouth daily with breakfast. 12/23/19   [provider]  prazosin (MINIPRESS) 1 MG capsule TAKE 1 CAPSULE BY MOUTH TWICE  DAILY 01/23/23   Charlton Amor, DO  Semaglutide,0.25 or 0.5MG /DOS, (OZEMPIC, 0.25 OR 0.5 MG/DOSE,) 2 MG/3ML SOPN Inject 0.25 mg into the skin once a week. 01/16/23   Charlton Amor, DO  spironolactone (ALDACTONE) 25 MG tablet Take 1 tablet (25 mg total) by mouth daily. 12/19/22   Charlton Amor, DO  tamsulosin (FLOMAX) 0.4 MG CAPS capsule TAKE 1 CAPSULE BY MOUTH DAILY  02/22/23   Wachs, Erika S, DO  TOUJEO SOLOSTAR 300 UNIT/ML Solostar Pen 60 Units. 01/19/20   [provider]     Vital Signs:PENDING   Code Status: FULL CODE  Physical Exam awake/alert; chest- CTA bilat; heart- RRR; abd- soft,obese, +BS,NT; no sig LE edema; intact distal pulses  Imaging: No results found.  Labs:  CBC: Recent Labs    09/11/22 1053  WBC 7.0  HGB 13.8  HCT 40.5  PLT 289    COAGS: No results for input(s): "INR", "APTT" in the last 8760 hours.  BMP: Recent Labs    09/11/22 1053 12/19/22 1154 01/16/23 1203 03/18/23 1435  NA 140 138 138 137  K 4.2 4.8 5.0 4.7  CL 105 102 103 101  CO2 24 26 25 26   GLUCOSE 168* 262* 183* 198*  BUN 31* 33* 23 33*  CALCIUM 10.4* 11.4* 10.9* 10.5*  CREATININE 1.31 1.23 1.43* 1.76*    LIVER FUNCTION TESTS: Recent Labs    09/11/22 1053  BILITOT 0.5  AST 18  ALT 19  PROT 6.9    Assessment and Plan: 72 yo male with PMH sig for DM, GERD, HLD, HTN, OSA . He also has advanced bilateral knee osteoarthritis (K&L III) with significant lifestyle limiting knee pain which is equal bilaterally (WOMAC 60/96, VAS 8/10). He has had multiple bilateral knee injections with minimal sustained relief, and is not a candidate for knee arthroplasty due to weight. Following consultation with  Dr. Elby Showers on 05/04/23  he was deemed an appropriate candidate for genicular artery embolization and presents today for left knee GAE initially followed later by rt knee GAE if pain improved. Risks and benefits of procedure were discussed with the patient including, but not limited to bleeding, infection, vascular injury or contrast induced renal failure.  This interventional procedure involves the use of X-rays and because of the nature of the planned procedure, it is possible that we will have prolonged use of X-ray fluoroscopy.  Potential radiation risks to you include (but are not limited to) the following: - A slightly elevated risk for  cancer  several years later in life. This risk is typically less than 0.5% percent. This risk is low in comparison to the normal incidence of human cancer, which is 33% for women and 50% for men according to the American Cancer Society. - Radiation induced injury can include skin redness, resembling a rash, tissue breakdown / ulcers and hair loss (which can be temporary or permanent).   The likelihood of either of these occurring depends on the difficulty of the procedure and whether you are sensitive to radiation due to previous procedures, disease, or genetic conditions.   IF your procedure requires a prolonged use of radiation, you will be notified and given written instructions for further action.  It is your responsibility to monitor the irradiated area for the  2 weeks following the procedure and to notify your physician if you are concerned that you have suffered a radiation induced injury.    All of the patient's questions were answered, patient is agreeable to proceed.  Consent signed and in chart.      Electronically Signed: D. Jeananne Rama, PA-C 06/22/2023, 4:18 PM   I spent a total of  25 minutes at the the patient's bedside AND on the patient's hospital floor or unit, greater than 50% of which was counseling/coordinating care for left genicular artery embolization

## 2023-06-23 ENCOUNTER — Encounter (HOSPITAL_COMMUNITY): Payer: Self-pay

## 2023-06-23 ENCOUNTER — Ambulatory Visit (HOSPITAL_COMMUNITY)
Admission: RE | Admit: 2023-06-23 | Discharge: 2023-06-23 | Disposition: A | Discharge status: Home / Self Care | Payer: Medicare Other | Source: Ambulatory Visit | Attending: Interventional Radiology

## 2023-06-23 ENCOUNTER — Other Ambulatory Visit (HOSPITAL_COMMUNITY): Payer: Self-pay | Admitting: Interventional Radiology

## 2023-06-23 ENCOUNTER — Other Ambulatory Visit: Payer: Self-pay

## 2023-06-23 ENCOUNTER — Ambulatory Visit (HOSPITAL_COMMUNITY)
Admission: RE | Admit: 2023-06-23 | Discharge: 2023-06-23 | Disposition: A | Discharge status: Home / Self Care | Payer: Medicare Other | Source: Ambulatory Visit | Attending: Interventional Radiology | Admitting: Interventional Radiology

## 2023-06-23 DIAGNOSIS — M25562 Pain in left knee: Secondary | ICD-10-CM | POA: Insufficient documentation

## 2023-06-23 DIAGNOSIS — Z794 Long term (current) use of insulin: Secondary | ICD-10-CM | POA: Insufficient documentation

## 2023-06-23 DIAGNOSIS — I10 Essential (primary) hypertension: Secondary | ICD-10-CM | POA: Diagnosis not present

## 2023-06-23 DIAGNOSIS — G4733 Obstructive sleep apnea (adult) (pediatric): Secondary | ICD-10-CM | POA: Diagnosis not present

## 2023-06-23 DIAGNOSIS — Z01818 Encounter for other preprocedural examination: Secondary | ICD-10-CM

## 2023-06-23 DIAGNOSIS — Z7984 Long term (current) use of oral hypoglycemic drugs: Secondary | ICD-10-CM | POA: Diagnosis not present

## 2023-06-23 DIAGNOSIS — E785 Hyperlipidemia, unspecified: Secondary | ICD-10-CM | POA: Insufficient documentation

## 2023-06-23 DIAGNOSIS — M170 Bilateral primary osteoarthritis of knee: Secondary | ICD-10-CM | POA: Insufficient documentation

## 2023-06-23 DIAGNOSIS — E1142 Type 2 diabetes mellitus with diabetic polyneuropathy: Secondary | ICD-10-CM | POA: Diagnosis not present

## 2023-06-23 DIAGNOSIS — K219 Gastro-esophageal reflux disease without esophagitis: Secondary | ICD-10-CM | POA: Diagnosis not present

## 2023-06-23 HISTORY — PX: IR ANGIOGRAM SELECTIVE EACH ADDITIONAL VESSEL: IMG667

## 2023-06-23 HISTORY — PX: IR ANGIOGRAM EXTREMITY LEFT: IMG651

## 2023-06-23 HISTORY — PX: IR US GUIDE VASC ACCESS RIGHT: IMG2390

## 2023-06-23 HISTORY — PX: IR EMBO ARTERIAL NOT HEMORR HEMANG INC GUIDE ROADMAPPING: IMG5448

## 2023-06-23 LAB — CBC
HCT: 41 % (ref 39.0–52.0)
Hemoglobin: 13.5 g/dL (ref 13.0–17.0)
MCH: 30.1 pg (ref 26.0–34.0)
MCHC: 32.9 g/dL (ref 30.0–36.0)
MCV: 91.3 fL (ref 80.0–100.0)
Platelets: 295 10*3/uL (ref 150–400)
RBC: 4.49 MIL/uL (ref 4.22–5.81)
RDW: 14.1 % (ref 11.5–15.5)
WBC: 7.1 10*3/uL (ref 4.0–10.5)
nRBC: 0 % (ref 0.0–0.2)

## 2023-06-23 LAB — BASIC METABOLIC PANEL
Anion gap: 9 (ref 5–15)
BUN: 24 mg/dL — ABNORMAL HIGH (ref 8–23)
CO2: 22 mmol/L (ref 22–32)
Calcium: 10.2 mg/dL (ref 8.9–10.3)
Chloride: 102 mmol/L (ref 98–111)
Creatinine, Ser: 1.79 mg/dL — ABNORMAL HIGH (ref 0.61–1.24)
GFR, Estimated: 40 mL/min — ABNORMAL LOW (ref >60)
Glucose, Bld: 220 mg/dL — ABNORMAL HIGH (ref 70–99)
Potassium: 4.4 mmol/L (ref 3.5–5.1)
Sodium: 133 mmol/L — ABNORMAL LOW (ref 135–145)

## 2023-06-23 LAB — PROTIME-INR
INR: 1.2 (ref 0.8–1.2)
Prothrombin Time: 15.7 s — ABNORMAL HIGH (ref 11.4–15.2)

## 2023-06-23 LAB — GLUCOSE, CAPILLARY: Glucose-Capillary: 221 mg/dL — ABNORMAL HIGH (ref 70–99)

## 2023-06-23 MED ORDER — DIPHENHYDRAMINE HCL 50 MG/ML IJ SOLN
25.0000 mg | Freq: Once | INTRAMUSCULAR | Status: AC
  Administered 2023-06-23: 25 mg via INTRAVENOUS

## 2023-06-23 MED ORDER — LIDOCAINE HCL 1 % IJ SOLN
INTRAMUSCULAR | Status: AC
  Filled 2023-06-23: qty 20

## 2023-06-23 MED ORDER — IODIXANOL 320 MG/ML IV SOLN
50.0000 mL | Freq: Once | INTRAVENOUS | Status: AC | PRN
  Administered 2023-06-23: 10 mL via INTRA_ARTERIAL

## 2023-06-23 MED ORDER — DIPHENHYDRAMINE HCL 50 MG/ML IJ SOLN
INTRAMUSCULAR | Status: AC
  Filled 2023-06-23: qty 1

## 2023-06-23 MED ORDER — SODIUM CHLORIDE 0.9 % IV SOLN: INTRAVENOUS | Status: DC

## 2023-06-23 MED ORDER — LORAZEPAM 0.5 MG PO TABS
1.0000 mg | ORAL_TABLET | Freq: Once | ORAL | Status: AC
  Administered 2023-06-23: 1 mg via ORAL
  Filled 2023-06-23: qty 2

## 2023-06-23 MED ORDER — NITROGLYCERIN 1 MG/10 ML FOR IR/CATH LAB
INTRA_ARTERIAL | Status: AC | PRN
  Administered 2023-06-23: 200 ug via INTRA_ARTERIAL

## 2023-06-23 MED ORDER — OXYCODONE HCL 5 MG PO TABS
5.0000 mg | ORAL_TABLET | Freq: Once | ORAL | Status: AC
  Administered 2023-06-23: 5 mg via ORAL
  Filled 2023-06-23: qty 1

## 2023-06-23 MED ORDER — ACETAMINOPHEN 10 MG/ML IV SOLN
1000.0000 mg | Freq: Once | INTRAVENOUS | Status: AC
  Administered 2023-06-23: 1000 mg via INTRAVENOUS
  Filled 2023-06-23: qty 100

## 2023-06-23 MED ORDER — IODIXANOL 320 MG/ML IV SOLN
50.0000 mL | Freq: Once | INTRAVENOUS | Status: AC | PRN
  Administered 2023-06-23: 11 mL via INTRA_ARTERIAL

## 2023-06-23 MED ORDER — KETOROLAC TROMETHAMINE 15 MG/ML IJ SOLN
15.0000 mg | Freq: Once | INTRAMUSCULAR | Status: AC
  Administered 2023-06-23: 15 mg via INTRAVENOUS
  Filled 2023-06-23 (×2): qty 1

## 2023-06-23 MED ORDER — IOHEXOL 300 MG/ML  SOLN: 100.0000 mL | Freq: Once | INTRAMUSCULAR | Status: DC | PRN

## 2023-06-23 MED ORDER — IODIXANOL 320 MG/ML IV SOLN
50.0000 mL | Freq: Once | INTRAVENOUS | Status: AC | PRN
  Administered 2023-06-23: 30 mL via INTRA_ARTERIAL

## 2023-06-23 MED ORDER — IODIXANOL 320 MG/ML IV SOLN
50.0000 mL | Freq: Once | INTRAVENOUS | Status: AC | PRN
  Administered 2023-06-23: 25 mL via INTRA_ARTERIAL

## 2023-06-23 MED ORDER — NITROGLYCERIN IN D5W 100-5 MCG/ML-% IV SOLN
INTRAVENOUS | Status: AC
  Filled 2023-06-23: qty 250

## 2023-06-23 NOTE — Sedation Documentation (Signed)
Pt unsedated due to not having a driver to take him home.

## 2023-06-23 NOTE — Procedures (Signed)
Interventional Radiology Procedure Note  Procedure:  Left geniculate artery embolization  Findings: Please refer to procedural dictation for full description.  0.7 mL 100-300 micron embospheres in articular branch of descending geniculate artery.  Right CFA 6 Fr Angioseal closure.  Complications: None immediate  Estimated Blood Loss: <5 ml  Recommendations: Strict 2 hour bedrest, head of bed flat for initial 30 minutes then up to 30 degrees for 30 minutes. IR will arrange for 1 month clinic follow up.   Marliss Coots, MD

## 2023-06-23 NOTE — Discharge Instructions (Addendum)
Please call Interventional Radiology clinic [phone] with any questions or concerns.  You may remove your dressing and shower tomorrow.  After the procedure, it is common to have: Pain or discomfort at the incision site Low-grade fever (temperature less than 101.5 degrees Fahrenheit or 38.6 degrees Celsius) for 4-7 days Tenderness and/or bruising around the puncture site. Bruising can take 2-3 weeks to go away completely. Please call immediately if you feel a lump that is growing or varies with your pulse  Medication: Do not use Aspirin or ibuprofen products, such as Advil or Motrin, as it may increase bleeding You may resume your usual medications as ordered by your doctor If your doctor prescribed antibiotics, take them as directed. Do not stop taking them just because you feel better  Eating and drinking: Drink plenty of liquids to keep your urine pale yellow You can resume your regular diet as directed by your doctor   Activity For procedures where we entered your artery from the top of your leg (groin):  Avoid strenuous activity, such as climbing long flights of stairs, for 24 hours after your procedure  No heavy lifting (greater than 15-20 pounds or 6-9 kg) for 7 days or until the puncture site heels Do not take baths, swim, or use a hot tub until your health care provider approves. Take showers only Keep all follow-up visits as told by your doctor  Care of the procedure site Follow instructions from your health care provider about how to take care of the puncture site.  Wash your hands with soap and water before you change your bandage (dressing). If soap and water are not available, use hand sanitizer Change your dressing as told by your health care provider Leave stitches (sutures), skin glue, or adhesive strips in place. These skin closures may need to stay in place for 2 weeks or longer. If adhesive strip edges start to loosen and curl up, you may trim the loose edges. Do  not remove adhesive strips completely unless your health care provider tells you to do that Check your puncture site every day for signs of infection. Check for: More redness, swelling, or pain Warmth/heat at puncture site Pus or a bad odor  Contact a health care provider if: You have a fever You have more redness, swelling, or pain around your incision You have more fluid or blood coming from your incision site Your incision feels warm to the touch You have pus or a bad smell coming from your incision You have a rash You have nausea, or you cannot eat or drink anything without vomiting  Get help right away if: You have trouble breathing You have chest pain You have severe pain in your abdomen, and it does not get better with medicine You have leg pain or leg swelling You feel dizzy, or you faint

## 2023-06-23 NOTE — OR Nursing (Signed)
Patient arrived to short stay alone, does not have anyone who can be with him to get home from the hospital, or anyone who can stay with him for 24 hours.  IR PA paged. Waiting for call back IR spoke with patient, and will do procedure without sedation.

## 2023-06-23 NOTE — Sedation Documentation (Signed)
Pt placed on 2L O2

## 2023-06-23 NOTE — Progress Notes (Signed)
HPI: FU TAA and CAD.  Cardiac CTA performed at [facility] October 2021 showed mild nonobstructive coronary disease and calcium score 335 which was 71st percentile.  There was note of ascending aorta measuring 44 mm.  Abdominal CT at [facility] March 2022 showed no aneurysm.  Echocardiogram March 2023 showed normal LV function, grade 1 diastolic dysfunction, moderate left atrial enlargement, mild aortic insufficiency, mildly dilated ascending aorta at 41 mm.  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  Since last seen he has some dyspnea on exertion that he attributes to deconditioning.  No orthopnea, PND, pedal edema, chest pain or syncope.  Current Outpatient Medications  Medication Sig Dispense Refill   amLODipine (NORVASC) 10 MG tablet TAKE 1 TABLET BY MOUTH DAILY 60 tablet 5   aspirin EC 81 MG tablet Take 1 tablet (81 mg total) by mouth daily. Swallow whole. 90 tablet 3   atorvastatin (LIPITOR) 80 MG tablet Take 80 mg by mouth daily.     buPROPion (WELLBUTRIN XL) 150 MG 24 hr tablet Take 1 tablet (150 mg total) by mouth every morning. 30 tablet 3   celecoxib (CELEBREX) 100 MG capsule Take 1 capsule (100 mg total) by mouth daily. 90 capsule 3   chlorthalidone (HYGROTON) 25 MG tablet TAKE 2 TABLETS BY MOUTH DAILY 180 tablet 3   Continuous Blood Gluc Receiver (FREESTYLE LIBRE 14 DAY READER) DEVI by Does not apply route.     Continuous Blood Gluc Sensor (FREESTYLE LIBRE 2 SENSOR) MISC PLACE ON SKIN AND CHANGE EVERY  14 DAYS 8 each 2   fenofibrate 160 MG tablet      furosemide (LASIX) 20 MG tablet TAKE 1 TABLET BY MOUTH DAILY 90 tablet 3   gabapentin (NEURONTIN) 300 MG capsule TAKE 1 CAPSULE BY MOUTH 3 TIMES  DAILY 300 capsule 2   glipiZIDE (GLUCOTROL XL) 10 MG 24 hr tablet Take 10 mg by mouth 2 (two) times daily.     glucose blood test strip 1 each by Other route as needed for other. Use as  instructed     HUMALOG KWIKPEN 100 UNIT/ML KwikPen Inject 30 units with each meal. 15 mL 7   HYDROcodone-acetaminophen (NORCO) 10-325 MG tablet Take 1 tablet by mouth every 8 (eight) hours as needed. 15 tablet 0   INSULIN SYRINGE .5CC/29G (B-D INS SYR ULTRAFINE .5CC/29G) 29G X 1/2" 0.5 ML MISC by Does not apply route.     lisinopril (ZESTRIL) 40 MG tablet TAKE 1 TABLET BY MOUTH DAILY 100 tablet 2   metFORMIN (GLUCOPHAGE) 1000 MG tablet Take 1,000 mg by mouth daily with breakfast.     prazosin (MINIPRESS) 1 MG capsule TAKE 1 CAPSULE BY MOUTH TWICE  DAILY 180 capsule 3   Semaglutide,0.25 or 0.5MG /DOS, (OZEMPIC, 0.25 OR 0.5 MG/DOSE,) 2 MG/3ML SOPN Inject 0.25 mg into the skin once a week. 3 mL 0   spironolactone (ALDACTONE) 25 MG tablet Take 1 tablet (25 mg total) by mouth daily. 90 tablet 3   tamsulosin (FLOMAX) 0.4 MG CAPS capsule TAKE 1 CAPSULE BY MOUTH DAILY 100 capsule 2   TOUJEO SOLOSTAR 300 UNIT/ML Solostar Pen 60 Units.     No current facility-administered medications for this visit.     Past Medical History:  Diagnosis Date   Diabetes mellitus (HCC)    Gastroesophageal reflux disease    Hyperlipidemia    Hypertension    Obstructive  sleep apnea     Past Surgical History:  Procedure Laterality Date   IR ANGIOGRAM EXTREMITY LEFT  06/23/2023   IR ANGIOGRAM SELECTIVE EACH ADDITIONAL VESSEL  06/23/2023   IR EMBO ARTERIAL NOT HEMORR HEMANG INC GUIDE ROADMAPPING  06/23/2023   IR RADIOLOGIST EVAL & MGMT  05/04/2023   IR US GUIDE VASC ACCESS RIGHT  06/23/2023   NECK SURGERY      Social History   Socioeconomic History   Marital status: Single    Spouse name: Not on file   Number of children: Not on file   Years of education: Not on file   Highest education level: 12th grade  Occupational History   Not on file  Tobacco Use   Smoking status: Never   Smokeless tobacco: Never  Substance and Sexual Activity   Alcohol use: Yes   Drug use: Not on file   Sexual activity: Not on file   Other Topics Concern   Not on file  Social History Narrative   Not on file   Social Determinants of Health   Financial Resource Strain: Low Risk  (02/09/2023)   Overall Financial Resource Strain (CARDIA)    Difficulty of Paying Living Expenses: Not very hard  Food Insecurity: No Food Insecurity (02/09/2023)   Hunger Vital Sign    Worried About Running Out of Food in the Last Year: Never true    Ran Out of Food in the Last Year: Never true  Transportation Needs: No Transportation Needs (02/09/2023)   PRAPARE - Administrator, Civil Service (Medical): No    Lack of Transportation (Non-Medical): No  Physical Activity: Insufficiently Active (02/09/2023)   Exercise Vital Sign    Days of Exercise per Week: 1 day    Minutes of Exercise per Session: 10 min  Stress: No Stress Concern Present (02/09/2023)   [employer] of Occupational Health - Occupational Stress Questionnaire    Feeling of Stress : Only a little  Social Connections: Socially Isolated (02/09/2023)   Social Connection and Isolation Panel [NHANES]    Frequency of Communication with Friends and Family: Never    Frequency of Social Gatherings with Friends and Family: Never    Attends Religious Services: Never    Database administrator or Organizations: No    Attends Engineer, structural: Not on file    Marital Status: Divorced  Intimate Partner Violence: Unknown (01/07/2022)   Received from [employer], [facility]   HITS    Physically Hurt: Not on file    Insult or Talk Down To: Not on file    Threaten Physical Harm: Not on file    Scream or Curse: Not on file    Family History  Problem Relation Age of Onset   Coronary artery disease Mother    Coronary artery disease Sister     ROS: no fevers or chills, productive cough, hemoptysis, dysphasia, odynophagia, melena, hematochezia, dysuria, hematuria, rash, seizure activity, orthopnea, PND, pedal edema, claudication. Remaining systems are  negative.  Physical Exam: Well-developed well-nourished in no acute distress.  Skin is warm and dry.  HEENT is normal.  Neck is supple.  Chest is clear to auscultation with normal expansion.  Cardiovascular exam is regular rate and rhythm.  Abdominal exam nontender or distended. No masses palpated. Extremities show no edema. neuro grossly intact  EKG Interpretation Date/Time:  Monday July 06 2023 10:14:59 EDT Ventricular Rate:  105 PR Interval:  184 QRS Duration:  150 QT Interval:  372 QTC Calculation: 491 R Axis:   -78  Text Interpretation: Sinus tachycardia Left axis deviation Right bundle branch block Inferior infarct , age undetermined Anterolateral infarct , age undetermined Confirmed by Olga Millers ([id number]) on 07/06/2023 10:24:46 AM    A/P  1 coronary artery disease-mild on previous CTA.  Patient denies chest pain.  Continue aspirin and statin.  Note his electrocardiogram shows prior inferolateral infarct.  This was present on previous ECGs.  I will repeat his echocardiogram to make sure that his LV function is normal.  2 thoracic aortic aneurysm-we will arrange follow-up CT.  I will do noncontrast as he has baseline renal insufficiency as well as diabetes mellitus and would be at risk for contrast nephropathy.  Hopefully this will be able to give a reasonable assessment.  3 pulmonary nodule-this will be reassessed with above CT.  4 hypertension-blood pressure controlled.  Continue present medical regimen.  5 hyperlipidemia-continue statin.  Check lipids and liver.  6 obstructive sleep apnea-continue CPAP.  Olga Millers, MD

## 2023-07-06 ENCOUNTER — Ambulatory Visit: Payer: Medicare Other | Admitting: Cardiology

## 2023-07-06 ENCOUNTER — Encounter: Payer: Self-pay | Admitting: Cardiology

## 2023-07-06 VITALS — BP 98/58 | HR 105 | Ht 73.0 in | Wt 299.0 lb

## 2023-07-06 DIAGNOSIS — R0609 Other forms of dyspnea: Secondary | ICD-10-CM | POA: Diagnosis not present

## 2023-07-06 DIAGNOSIS — E7800 Pure hypercholesterolemia, unspecified: Secondary | ICD-10-CM

## 2023-07-06 DIAGNOSIS — R9431 Abnormal electrocardiogram [ECG] [EKG]: Secondary | ICD-10-CM

## 2023-07-06 DIAGNOSIS — I7121 Aneurysm of the ascending aorta, without rupture: Secondary | ICD-10-CM | POA: Diagnosis not present

## 2023-07-06 NOTE — Patient Instructions (Signed)
    Testing/Procedures:  Your physician has requested that you have an echocardiogram. Echocardiography is a painless test that uses sound waves to create images of your heart. It provides your doctor with information about the size and shape of your heart and how well your heart's chambers and valves are working. This procedure takes approximately one hour. There are no restrictions for this procedure. Please do NOT wear cologne, perfume, aftershave, or lotions (deodorant is allowed). Please arrive 15 minutes prior to your appointment time. [facility]-1ST FLOOR IMAGING DEPARTMENT  CT WO CONTRAST AT [facility] ST FLOOR IMAGING DEPARTMENT   Follow-Up: At [facility], you and your health needs are our priority.  As part of our continuing mission to provide you with exceptional heart care, we have created designated Provider Care Teams.  These Care Teams include your primary Cardiologist (physician) and Advanced Practice Providers (APPs -  Physician Assistants and Nurse Practitioners) who all work together to provide you with the care you need, when you need it.  We recommend signing up for the patient portal called "MyChart".  Sign up information is provided on this After Visit Summary.  MyChart is used to connect with patients for Virtual Visits (Telemedicine).  Patients are able to view lab/test results, encounter notes, upcoming appointments, etc.  Non-urgent messages can be sent to your provider as well.   To learn more about what you can do with MyChart, go to [email or web address].    Your next appointment:   12 month(s)  Provider:   Olga Millers MD

## 2023-07-07 ENCOUNTER — Telehealth: Payer: Self-pay

## 2023-07-07 NOTE — Telephone Encounter (Signed)
Received 8 boxes of Novolog Flexpen ( 5 pens per box)  from patient assistance   And 3 boxes of Tresiba  Patient informed ( left detailed voice mail message on patient home # allowed on DPR )   In checking patient chart - neither med shows on medication list -  Is patient suppose to be on these two medications?

## 2023-07-08 ENCOUNTER — Other Ambulatory Visit: Payer: Self-pay

## 2023-07-09 ENCOUNTER — Ambulatory Visit (INDEPENDENT_AMBULATORY_CARE_PROVIDER_SITE_OTHER): Payer: Medicare Other | Admitting: Family Medicine

## 2023-07-09 ENCOUNTER — Encounter: Payer: Self-pay | Admitting: Family Medicine

## 2023-07-09 VITALS — BP 122/65 | HR 111 | Ht 73.0 in | Wt 308.8 lb

## 2023-07-09 DIAGNOSIS — E785 Hyperlipidemia, unspecified: Secondary | ICD-10-CM | POA: Diagnosis not present

## 2023-07-09 DIAGNOSIS — F321 Major depressive disorder, single episode, moderate: Secondary | ICD-10-CM

## 2023-07-09 DIAGNOSIS — Z6841 Body Mass Index (BMI) 40.0 and over, adult: Secondary | ICD-10-CM

## 2023-07-09 DIAGNOSIS — N1831 Chronic kidney disease, stage 3a: Secondary | ICD-10-CM | POA: Diagnosis not present

## 2023-07-09 DIAGNOSIS — E1165 Type 2 diabetes mellitus with hyperglycemia: Secondary | ICD-10-CM | POA: Diagnosis not present

## 2023-07-09 DIAGNOSIS — E66813 Obesity, class 3: Secondary | ICD-10-CM

## 2023-07-09 DIAGNOSIS — Z794 Long term (current) use of insulin: Secondary | ICD-10-CM

## 2023-07-09 LAB — POCT GLYCOSYLATED HEMOGLOBIN (HGB A1C): Hemoglobin A1C: 7.4 % — AB (ref 4.0–5.6)

## 2023-07-09 MED ORDER — ESCITALOPRAM OXALATE 10 MG PO TABS: 10.0000 mg | ORAL_TABLET | Freq: Every day | ORAL | 3 refills | Status: DC

## 2023-07-09 NOTE — Assessment & Plan Note (Signed)
-   repeat BMP

## 2023-07-09 NOTE — Progress Notes (Signed)
Established patient visit   Patient: Danny Montoya   DOB: [birth year]   72 y.o. Male  MRN: [id number] Visit Date: 07/09/2023  Today's healthcare provider: Charlton Amor, DO   Chief Complaint  Patient presents with   Medical Management of Chronic Issues    Depression, anxiety, fatigueness    SUBJECTIVE    Chief Complaint  Patient presents with   Medical Management of Chronic Issues    Depression, anxiety, fatigueness   HPI HPI     Medical Management of Chronic Issues    Additional comments: Depression, anxiety, fatigueness      Last edited by Roselyn Reef, CMA on 07/09/2023 11:07 AM.      Pt presents for follow up on wellbutrin. Started by sports med as he was depressed when he was seen by them. Admits to worsening depression. Says his refrigerator and lawn mower broke, his kitchen is a mess and he has no energy to clean. He doesn't feel like doing anything during the day.   T2DM - pt following up on DM as well  - had what sounds like a vein procedure that has limited his ability to move and exercise.   Review of Systems  Constitutional:  Negative for activity change, fatigue and fever.  Respiratory:  Negative for cough and shortness of breath.   Cardiovascular:  Negative for chest pain.  Gastrointestinal:  Negative for abdominal pain.  Genitourinary:  Negative for difficulty urinating.  Psychiatric/Behavioral:  Positive for sleep disturbance. Negative for suicidal ideas.        Current Meds  Medication Sig   amLODipine (NORVASC) 10 MG tablet TAKE 1 TABLET BY MOUTH DAILY   aspirin EC 81 MG tablet Take 1 tablet (81 mg total) by mouth daily. Swallow whole.   atorvastatin (LIPITOR) 80 MG tablet Take 80 mg by mouth daily.   celecoxib (CELEBREX) 100 MG capsule Take 1 capsule (100 mg total) by mouth daily.   chlorthalidone (HYGROTON) 25 MG tablet TAKE 2 TABLETS BY MOUTH DAILY   Continuous Blood Gluc Receiver (FREESTYLE LIBRE 14 DAY READER) DEVI by Does not apply route.    Continuous Blood Gluc Sensor (FREESTYLE LIBRE 2 SENSOR) MISC PLACE ON SKIN AND CHANGE EVERY  14 DAYS   fenofibrate 160 MG tablet    furosemide (LASIX) 20 MG tablet TAKE 1 TABLET BY MOUTH DAILY   gabapentin (NEURONTIN) 300 MG capsule TAKE 1 CAPSULE BY MOUTH 3 TIMES  DAILY   glucose blood test strip 1 each by Other route as needed for other. Use as instructed   HUMALOG KWIKPEN 100 UNIT/ML KwikPen Inject 30 units with each meal.   HYDROcodone-acetaminophen (NORCO) 10-325 MG tablet Take 1 tablet by mouth every 8 (eight) hours as needed.   insulin aspart (NOVOLOG FLEXPEN) 100 UNIT/ML FlexPen Inject into the skin 3 (three) times daily with meals.   insulin degludec (TRESIBA FLEXTOUCH) 100 UNIT/ML FlexTouch Pen Inject into the skin daily.   INSULIN SYRINGE .5CC/29G (B-D INS SYR ULTRAFINE .5CC/29G) 29G X 1/2" 0.5 ML MISC by Does not apply route.   lisinopril (ZESTRIL) 40 MG tablet TAKE 1 TABLET BY MOUTH DAILY   metFORMIN (GLUCOPHAGE) 1000 MG tablet Take 1,000 mg by mouth daily with breakfast.   prazosin (MINIPRESS) 1 MG capsule TAKE 1 CAPSULE BY MOUTH TWICE  DAILY   Semaglutide,0.25 or 0.5MG /DOS, (OZEMPIC, 0.25 OR 0.5 MG/DOSE,) 2 MG/3ML SOPN Inject 0.25 mg into the skin once a week.   spironolactone (ALDACTONE) 25 MG tablet Take 1 tablet (  25 mg total) by mouth daily.   tamsulosin (FLOMAX) 0.4 MG CAPS capsule TAKE 1 CAPSULE BY MOUTH DAILY   TOUJEO SOLOSTAR 300 UNIT/ML Solostar Pen 60 Units.   [DISCONTINUED] buPROPion (WELLBUTRIN XL) 150 MG 24 hr tablet Take 1 tablet (150 mg total) by mouth every morning.   [DISCONTINUED] escitalopram (LEXAPRO) 10 MG tablet Take 1 tablet (10 mg total) by mouth at bedtime.   [DISCONTINUED] glipiZIDE (GLUCOTROL XL) 10 MG 24 hr tablet Take 10 mg by mouth 2 (two) times daily.    OBJECTIVE    BP 122/65 (BP Location: Left Arm, Patient Position: Sitting, Cuff Size: Large)   Pulse (!) 111   Ht 6\' 1"  (1.854 m)   Wt (!) 308 lb 12 oz (140 kg)   SpO2 95%   BMI 40.73 kg/m    Physical Exam Vitals and nursing note reviewed.  Constitutional:      General: He is not in acute distress.    Appearance: Normal appearance.  HENT:     Head: Normocephalic and atraumatic.     Right Ear: External ear normal.     Left Ear: External ear normal.     Nose: Nose normal.  Eyes:     Conjunctiva/sclera: Conjunctivae normal.  Cardiovascular:     Rate and Rhythm: Normal rate and regular rhythm.  Pulmonary:     Effort: Pulmonary effort is normal.     Breath sounds: Normal breath sounds.  Neurological:     General: No focal deficit present.     Mental Status: He is alert and oriented to person, place, and time.  Psychiatric:        Behavior: Behavior normal.        Thought Content: Thought content normal.        Judgment: Judgment normal.        ASSESSMENT & PLAN    Problem List Items Addressed This Visit       Endocrine   Type 2 diabetes mellitus with hyperglycemia, with long-term current use of insulin (HCC) - Primary    A1c up to 7.6 - encouraged healthier diet Taking pt off glipizide as I am wondering if some of the lethargy is from this        Relevant Orders   POCT HgB A1C (Completed)   CMP14+EGFR     Genitourinary   Chronic kidney disease, stage 3a (HCC)    - repeat BMP         Other   Class 3 severe obesity due to excess calories without serious comorbidity with body mass index (BMI) of 40.0 to 44.9 in adult [facility])    - continue ozempic encouraged exercise      Depression, major, single episode, moderate (HCC)    Stop wellbutrin-pt says it is not helping - referral sent to therapy - start lexapro 10mg  to see if we can get a better hold on depression      Relevant Medications   escitalopram (LEXAPRO) 10 MG tablet   Other Visit Diagnoses     Hyperlipidemia, unspecified hyperlipidemia type       Relevant Orders   Lipid panel   Current moderate episode of major depressive disorder without prior episode (HCC)       Relevant Medications    escitalopram (LEXAPRO) 10 MG tablet   Other Relevant Orders   Ambulatory referral to Behavioral Health       Return in about 4 weeks (around 08/06/2023) for MDD.      Meds ordered  this encounter  Medications   DISCONTD: escitalopram (LEXAPRO) 10 MG tablet    Sig: Take 1 tablet (10 mg total) by mouth at bedtime.    Dispense:  30 tablet    Refill:  3   escitalopram (LEXAPRO) 10 MG tablet    Sig: Take 1 tablet (10 mg total) by mouth at bedtime.    Dispense:  30 tablet    Refill:  3    Orders Placed This Encounter  Procedures   Lipid panel    Order Specific Question:   Has the patient fasted?    Answer:   No    Order Specific Question:   Release to patient    Answer:   Immediate   CMP14+EGFR    Order Specific Question:   Has the patient fasted?    Answer:   No   Ambulatory referral to Behavioral Health    Referral Priority:   Routine    Referral Type:   Psychiatric    Referral Reason:   Specialty Services Required    Requested Specialty:   Behavioral Health    Number of Visits Requested:   1   POCT HgB A1C     Charlton Amor, DO  [facility] at [facility] [phone] (phone) [phone] (fax)  [facility]

## 2023-07-09 NOTE — Assessment & Plan Note (Signed)
Stop wellbutrin-pt says it is not helping - referral sent to therapy - start lexapro 10mg  to see if we can get a better hold on depression

## 2023-07-09 NOTE — Assessment & Plan Note (Signed)
A1c up to 7.6 - encouraged healthier diet Taking pt off glipizide as I am wondering if some of the lethargy is from this

## 2023-07-09 NOTE — Assessment & Plan Note (Signed)
-   continue ozempic encouraged exercise

## 2023-07-09 NOTE — Patient Instructions (Signed)
Stop glipizide  Stop wellbutrin   Start lexapro 10mg  at night

## 2023-07-10 LAB — CMP14+EGFR
ALT: 10 [IU]/L (ref 0–44)
AST: 15 [IU]/L (ref 0–40)
Albumin: 4.5 g/dL (ref 3.9–4.9)
Alkaline Phosphatase: 42 [IU]/L — ABNORMAL LOW (ref 44–121)
BUN/Creatinine Ratio: 18 (ref 10–24)
BUN: 31 mg/dL — ABNORMAL HIGH (ref 8–27)
Bilirubin Total: 0.8 mg/dL (ref 0.0–1.2)
CO2: 20 mmol/L (ref 20–29)
Calcium: 10.7 mg/dL — ABNORMAL HIGH (ref 8.6–10.2)
Chloride: 99 mmol/L (ref 96–106)
Creatinine, Ser: 1.74 mg/dL — ABNORMAL HIGH (ref 0.76–1.27)
Globulin, Total: 2.5 g/dL (ref 1.5–4.5)
Glucose: 219 mg/dL — ABNORMAL HIGH (ref 70–99)
Potassium: 4.2 mmol/L (ref 3.5–5.2)
Sodium: 136 mmol/L (ref 134–144)
Total Protein: 7 g/dL (ref 6.0–8.5)
eGFR: 42 mL/min/{1.73_m2} — ABNORMAL LOW (ref >59)

## 2023-07-10 LAB — LIPID PANEL
Chol/HDL Ratio: 5.7 {ratio} — ABNORMAL HIGH (ref 0.0–5.0)
Cholesterol, Total: 198 mg/dL (ref 100–199)
HDL: 35 mg/dL — ABNORMAL LOW (ref >39)
LDL Chol Calc (NIH): 123 mg/dL — ABNORMAL HIGH (ref 0–99)
Triglycerides: 228 mg/dL — ABNORMAL HIGH (ref 0–149)
VLDL Cholesterol Cal: 40 mg/dL (ref 5–40)

## 2023-07-10 NOTE — Telephone Encounter (Signed)
Medications added to patient chart.

## 2023-07-13 ENCOUNTER — Telehealth: Payer: Self-pay

## 2023-07-13 NOTE — Telephone Encounter (Signed)
Forwarding to [place] as an Financial planner.  Tiffany  [employer] PAP shipment for NovoFine 32 G tip needles / 5 boxes received this morning. Please contact the patient to come and pick up their order today. Placed in the front office cabinet with patient identifier. Thanks in advance.   LIST: [id number]  LOT: [id number] EXP: 2027-12-04

## 2023-07-18 NOTE — Progress Notes (Signed)
Reason for follow up: Patient was seen in virtual telephone consultation today for bilateral knee pain s/p left GAE  Referring Physician(s): Rodney Langton J   History of present illness: HPI from initial consult 05/04/23 Danny Montoya is a 72 y.o. male with a medical history significant for DM, HTN, obstructive sleep apnea, obesity, right shoulder and bilateral knee osteoarthritis. He was recently referred to orthopedics for severe end-stage bilateral knee osteoarthritis with varus deformity. He has had several injections in the past and these have provided a few month's of relief. He was evaluated by Dr. Benjamin Stain 04/16/23 who aspirated both knees and also injected both knees with steroids. Dr. Roderic Palau did not think viscosupplementation would be effective for the patient at this stage and the patient's BMI is too high for arthroplasty. He has kindly referred the patient to Interventional Radiology to discuss genicular artery embolization.    The patient presents today via virtual telephone visit to discuss this procedure.     Danny Montoya has had knee pain for over a decade.  No known knee injuries.  This has primarily been in the left knee, though recently his right knee is painful, now equal bilaterally.  He finds it very difficult to stand.   He has had 4-5 steroid injections on the left over the past year, and 1 injection in the right.  He is not a candidate for knee arthroplasty due to his weight.  He is currently on Ozempic, htough has not significantly lost weight.  He has considered pursuing gastric bypass.   Due to his knee pain, he is unable to exercise or do yard work.  He would prefer to treat the left first.  Danny Montoya lives alone, retired from grocery, Higher education careers adviser, and working as a Production designer, theatre/television/film at [employer].     WOMAC Knee Pain Score: 60/96 VAS Pain Score: 8/10  At our 05/04/23 consultation I discussed the details of geniculate artery embolization and he was amenable  to proceeding. He underwent left geniculate artery embolization 06/23/23 and tolerated this procedure well. He presents today via virtual telephone visit for follow up.   Past Medical History:  Diagnosis Date   Diabetes mellitus (HCC)    Gastroesophageal reflux disease    Hyperlipidemia    Hypertension    Obstructive sleep apnea     Past Surgical History:  Procedure Laterality Date   IR ANGIOGRAM EXTREMITY LEFT  06/23/2023   IR ANGIOGRAM SELECTIVE EACH ADDITIONAL VESSEL  06/23/2023   IR EMBO ARTERIAL NOT HEMORR HEMANG INC GUIDE ROADMAPPING  06/23/2023   IR RADIOLOGIST EVAL & MGMT  05/04/2023   IR US GUIDE VASC ACCESS RIGHT  06/23/2023   NECK SURGERY      Allergies: Penicillins  Medications: Prior to Admission medications   Medication Sig Start Date End Date Taking? Authorizing Provider  amLODipine (NORVASC) 10 MG tablet TAKE 1 TABLET BY MOUTH DAILY 06/04/23   Lewayne Bunting, MD  aspirin EC 81 MG tablet Take 1 tablet (81 mg total) by mouth daily. Swallow whole. 07/20/20   Lewayne Bunting, MD  atorvastatin (LIPITOR) 80 MG tablet Take 80 mg by mouth daily. 01/27/20   [provider]  celecoxib (CELEBREX) 100 MG capsule Take 1 capsule (100 mg total) by mouth daily. 04/16/23   Monica Becton, MD  chlorthalidone (HYGROTON) 25 MG tablet TAKE 2 TABLETS BY MOUTH DAILY 01/20/23   Charlton Amor, DO  Continuous Blood Gluc Receiver (FREESTYLE LIBRE 14 DAY READER) DEVI by  Does not apply route.    [provider]  Continuous Blood Gluc Sensor (FREESTYLE LIBRE 2 SENSOR) MISC PLACE ON SKIN AND CHANGE EVERY  14 DAYS 11/20/22   Morey Hummingbird S, DO  escitalopram (LEXAPRO) 10 MG tablet Take 1 tablet (10 mg total) by mouth at bedtime. 07/09/23   Charlton Amor, DO  fenofibrate 160 MG tablet  01/27/20   [provider]  furosemide (LASIX) 20 MG tablet TAKE 1 TABLET BY MOUTH DAILY 10/29/22   Everrett Coombe, DO  gabapentin (NEURONTIN) 300 MG capsule TAKE 1 CAPSULE BY MOUTH 3  TIMES  DAILY 01/28/23   Morey Hummingbird S, DO  glucose blood test strip 1 each by Other route as needed for other. Use as instructed    [provider]  HUMALOG KWIKPEN 100 UNIT/ML KwikPen Inject 30 units with each meal. 01/16/23   Tamera Punt, Colbert Coyer, DO  HYDROcodone-acetaminophen (NORCO) 10-325 MG tablet Take 1 tablet by mouth every 8 (eight) hours as needed. 05/28/23   Monica Becton, MD  insulin aspart (NOVOLOG FLEXPEN) 100 UNIT/ML FlexPen Inject into the skin 3 (three) times daily with meals.    [provider]  insulin degludec (TRESIBA FLEXTOUCH) 100 UNIT/ML FlexTouch Pen Inject into the skin daily.    [provider]  INSULIN SYRINGE .5CC/29G (B-D INS SYR ULTRAFINE .5CC/29G) 29G X 1/2" 0.5 ML MISC by Does not apply route.    [provider]  lisinopril (ZESTRIL) 40 MG tablet TAKE 1 TABLET BY MOUTH DAILY 01/05/23   Morey Hummingbird S, DO  metFORMIN (GLUCOPHAGE) 1000 MG tablet Take 1,000 mg by mouth daily with breakfast. 12/23/19   [provider]  prazosin (MINIPRESS) 1 MG capsule TAKE 1 CAPSULE BY MOUTH TWICE  DAILY 01/23/23   Charlton Amor, DO  Semaglutide,0.25 or 0.5MG /DOS, (OZEMPIC, 0.25 OR 0.5 MG/DOSE,) 2 MG/3ML SOPN Inject 0.25 mg into the skin once a week. 01/16/23   Charlton Amor, DO  spironolactone (ALDACTONE) 25 MG tablet Take 1 tablet (25 mg total) by mouth daily. 12/19/22   Charlton Amor, DO  tamsulosin (FLOMAX) 0.4 MG CAPS capsule TAKE 1 CAPSULE BY MOUTH DAILY 02/22/23   Wachs, Erika S, DO  TOUJEO SOLOSTAR 300 UNIT/ML Solostar Pen 60 Units. 01/19/20   [provider]     Family History  Problem Relation Age of Onset   Coronary artery disease Mother    Coronary artery disease Sister     Social History   Socioeconomic History   Marital status: Single    Spouse name: Not on file   Number of children: Not on file   Years of education: Not on file   Highest education level: 12th grade  Occupational History   Not on file  Tobacco  Use   Smoking status: Never   Smokeless tobacco: Never  Substance and Sexual Activity   Alcohol use: Yes   Drug use: Not on file   Sexual activity: Not on file  Other Topics Concern   Not on file  Social History Narrative   Not on file   Social Determinants of Health   Financial Resource Strain: Low Risk  (02/09/2023)   Overall Financial Resource Strain (CARDIA)    Difficulty of Paying Living Expenses: Not very hard  Food Insecurity: No Food Insecurity (02/09/2023)   Hunger Vital Sign    Worried About Running Out of Food in the Last Year: Never true    Ran Out of Food in the Last Year: Never  true  Transportation Needs: No Transportation Needs (02/09/2023)   PRAPARE - Administrator, Civil Service (Medical): No    Lack of Transportation (Non-Medical): No  Physical Activity: Insufficiently Active (02/09/2023)   Exercise Vital Sign    Days of Exercise per Week: 1 day    Minutes of Exercise per Session: 10 min  Stress: No Stress Concern Present (02/09/2023)   [employer] of Occupational Health - Occupational Stress Questionnaire    Feeling of Stress : Only a little  Social Connections: Socially Isolated (02/09/2023)   Social Connection and Isolation Panel [NHANES]    Frequency of Communication with Friends and Family: Never    Frequency of Social Gatherings with Friends and Family: Never    Attends Religious Services: Never    Database administrator or Organizations: No    Attends Engineer, structural: Not on file    Marital Status: Divorced     Vital Signs: There were no vitals taken for this visit.  No physical exam was performed in lieu of virtual telephone visit.   Imaging: Bilateral knee radiographs 04/16/23   Kellgren and Lyman Bishop Grade III bilaterally.    Labs:  CBC: Recent Labs    09/11/22 1053 06/23/23 0908  WBC 7.0 7.1  HGB 13.8 13.5  HCT 40.5 41.0  PLT 289 295    COAGS: Recent Labs    06/23/23 0908  INR 1.2    BMP: Recent  Labs    01/16/23 1203 03/18/23 1435 06/23/23 0908 07/09/23 1135  NA 138 137 133* 136  K 5.0 4.7 4.4 4.2  CL 103 101 102 99  CO2 25 26 22 20   GLUCOSE 183* 198* 220* 219*  BUN 23 33* 24* 31*  CALCIUM 10.9* 10.5* 10.2 10.7*  CREATININE 1.43* 1.76* 1.79* 1.74*  GFRNONAA  --   --  40*  --     LIVER FUNCTION TESTS: Recent Labs    09/11/22 1053 07/09/23 1135  BILITOT 0.5 0.8  AST 18 15  ALT 19 10  ALKPHOS  --  42*  PROT 6.9 7.0  ALBUMIN  --  4.5    Assessment and Plan:  72 year old male with a medical history significant for advanced bilateral knee osteoarthritis (K&L III) with significant lifestyle limiting knee pain which is equal bilaterally (WOMAC 60/96, VAS 8/10).  He has had multiple bilateral knee injections with minimal sustained relief, and is not a candidate for knee arthroplasty due to weight. He was interested in pursuing geniculate artery embolization started with the left knee. This was performed 06/23/23.  Electronically Signed: Mickie Kay 07/18/2023, 11:36 AM   I spent a total of 25 Minutes in virtual telephone clinical consultation, greater than 50% of which was counseling/coordinating care for bilateral knee osteoarthritis.

## 2023-07-20 ENCOUNTER — Encounter: Payer: Self-pay | Admitting: *Deleted

## 2023-07-20 DIAGNOSIS — E7800 Pure hypercholesterolemia, unspecified: Secondary | ICD-10-CM

## 2023-07-22 ENCOUNTER — Other Ambulatory Visit (HOSPITAL_COMMUNITY): Payer: Self-pay | Admitting: Interventional Radiology

## 2023-07-22 ENCOUNTER — Inpatient Hospital Stay
Admission: RE | Admit: 2023-07-22 | Discharge: 2023-07-22 | Disposition: A | Discharge status: Home / Self Care | Payer: Medicare Other | Source: Ambulatory Visit | Attending: Radiology | Admitting: Radiology

## 2023-07-22 DIAGNOSIS — M25562 Pain in left knee: Secondary | ICD-10-CM | POA: Diagnosis not present

## 2023-07-22 DIAGNOSIS — M25561 Pain in right knee: Secondary | ICD-10-CM | POA: Diagnosis not present

## 2023-07-22 DIAGNOSIS — M1711 Unilateral primary osteoarthritis, right knee: Secondary | ICD-10-CM

## 2023-07-22 HISTORY — PX: IR RADIOLOGIST EVAL & MGMT: IMG5224

## 2023-07-23 MED ORDER — EZETIMIBE 10 MG PO TABS
10.0000 mg | ORAL_TABLET | Freq: Every day | ORAL | 3 refills | Status: DC
Start: 2023-07-23 — End: 2024-06-09

## 2023-07-24 ENCOUNTER — Other Ambulatory Visit: Payer: Self-pay

## 2023-07-24 NOTE — Progress Notes (Unsigned)
07/24/2023 Name: Danny Montoya MRN: [id number] DOB: 09-Apr-1951  Chief Complaint  Patient presents with   Medication Management   Danny Montoya is a 72 y.o. year old male who presented for a telephone visit to follow-up on medication management of T2DM and HLD  Subjective:  Care Team: Primary Care Provider: Charlton Amor, DO ; Next Scheduled Visit: 08/06/23  Medication Access/Adherence Current Pharmacy:  [facility]) - [place], [place] - [address] [facility] [address] [place] [address] [place] [address] Phone: [phone] Fax: [phone]  [facility] [address] [place] [address] [place] [address] Phone: [phone] Fax: [phone]  [facility] - [place], [place] - [address] [place] [address] Phone: [phone] Fax: [phone]  -Patient reports affordability concerns with their medications: No  -Patient reports access/transportation concerns to their pharmacy: No  -Patient reports adherence concerns with their medications:  Yes  Out of Ozempic   Diabetes: Current medications: novolog 40 units TID cf, Tresiba 30 units daily, metformin 1000 mg daily with breakfast, Ozempic 1mg  weekly -Medications tried in the past: glipizide was recently stopped to see if this would decrease lethargy patient has been experiencing, but he does not endorse a difference in his energy level -Patient is using Libre 2 for CGM  Patient {Actions; denies-reports:120008} hypoglycemic s/sx including ***dizziness, shakiness, sweating. Patient {Actions; denies-reports:120008} hyperglycemic symptoms including ***polyuria, polydipsia, polyphagia, nocturia, neuropathy, blurred vision.  Current meal patterns:  - Breakfast: *** - Lunch *** - Supper *** - Snacks *** - Drinks ***  Current physical activity: ***  Current medication access support:  *** Hyperlipidemia/ASCVD Risk Reduction  Current lipid lowering medications:  Medications tried in the past:   Antiplatelet regimen:   ASCVD History:  Family History:  Risk Factors:   Current physical activity: ***  Current medication access support: ***  @[email or web address] @  PREVENT Risk Score: 10 year risk of CVD: *** - 10 year risk of ASCVD: *** - 10 year risk of HF: ***   Objective: Lab Results  Component Value Date   HGBA1C 7.4 (A) 07/09/2023   Lab Results  Component Value Date   CREATININE 1.74 (H) 07/09/2023   BUN 31 (H) 07/09/2023   NA 136 07/09/2023   K 4.2 07/09/2023   CL 99 07/09/2023   CO2 20 07/09/2023   Lab Results  Component Value Date   CHOL 198 07/09/2023   HDL 35 (L) 07/09/2023   LDLCALC 123 (H) 07/09/2023   TRIG 228 (H) 07/09/2023   CHOLHDL 5.7 (H) 07/09/2023   Medications Reviewed Today     Reviewed by [name], RPH (Pharmacist) on 07/24/23 at 1122  Med List Status: <None>   Medication Order Taking? Sig Documenting Provider Last Dose Status Informant  amLODipine (NORVASC) 10 MG tablet [id number]  TAKE 1 TABLET BY MOUTH DAILY Lewayne Bunting, MD  Active   aspirin EC 81 MG tablet [id number]  Take 1 tablet (81 mg total) by mouth daily. Swallow whole. Lewayne Bunting, MD  Active   atorvastatin (LIPITOR) 80 MG tablet [id number] Yes Take 80 mg by mouth daily. [provider] Taking Active   celecoxib (CELEBREX) 100 MG capsule [id number] No Take 1 capsule (100 mg total) by mouth daily.  Patient not taking: Reported on 07/24/2023   Monica Becton, MD Not Taking Active   chlorthalidone (HYGROTON) 25 MG tablet [id number]  TAKE  2 TABLETS BY MOUTH DAILY Charlton Amor, DO  Active   Continuous Blood Gluc Receiver (FREESTYLE LIBRE 14 DAY READER) DEVI [id number]  by Does not apply route. [provider]  Active            Med Note Littie Deeds, Aprille Sawhney A   Fri Jul 24, 2023 11:19 AM) Josephine Igo 2  Continuous Blood Gluc Sensor (FREESTYLE  LIBRE 2 SENSOR) [place] [id number] Yes PLACE ON SKIN AND CHANGE EVERY  14 DAYS Tamera Punt, Erika S, DO Taking Active   escitalopram (LEXAPRO) 10 MG tablet [id number]  Take 1 tablet (10 mg total) by mouth at bedtime. Charlton Amor, DO  Active   ezetimibe (ZETIA) 10 MG tablet [id number] No Take 1 tablet (10 mg total) by mouth daily.  Patient not taking: Reported on 07/24/2023   Lewayne Bunting, MD Not Taking Active            Med Note Littie Deeds, [place] A   Fri Jul 24, 2023 11:10 AM) Has not got yet  fenofibrate 160 MG tablet [id number] No   Patient not taking: Reported on 07/24/2023   [provider] Not Taking Active   furosemide (LASIX) 20 MG tablet [id number]  TAKE 1 TABLET BY MOUTH DAILY Everrett Coombe, DO  Active   gabapentin (NEURONTIN) 300 MG capsule [id number]  TAKE 1 CAPSULE BY MOUTH 3 TIMES  DAILY Wachs, Erika S, DO  Active   glucose blood test strip [id number]  1 each by Other route as needed for other. Use as instructed [provider]  Active   HYDROcodone-acetaminophen (NORCO) 10-325 MG tablet [id number]  Take 1 tablet by mouth every 8 (eight) hours as needed. Monica Becton, MD  Active   insulin aspart (NOVOLOG FLEXPEN) 100 UNIT/ML FlexPen [id number] Yes Inject 40 Units into the skin 3 (three) times daily with meals. [provider] Taking Active            Med Note Littie Deeds, Holli Rengel A   Fri Jul 24, 2023 11:15 AM) PAP  insulin degludec (TRESIBA FLEXTOUCH) 100 UNIT/ML FlexTouch Pen [id number] Yes Inject 30 Units into the skin daily. [provider] Taking Active            Med Note Littie Deeds, Ayvin Lipinski A   Fri Jul 24, 2023 11:16 AM) PAP  INSULIN SYRINGE .5CC/29G (B-D INS SYR ULTRAFINE .5CC/29G) 29G X 1/2" 0.5 ML MISC [id number]  by Does not apply route. [provider]  Active   lisinopril (ZESTRIL) 40 MG tablet [id number]  TAKE 1 TABLET BY MOUTH DAILY Wachs, Erika S, DO  Active   metFORMIN (GLUCOPHAGE) 1000 MG tablet [id number] Yes Take 1,000 mg by mouth  daily with breakfast. [provider] Taking Active   prazosin (MINIPRESS) 1 MG capsule [id number]  TAKE 1 CAPSULE BY MOUTH TWICE  DAILY Wachs, Erika S, DO  Active   Semaglutide,0.25 or 0.5MG /DOS, (OZEMPIC, 0.25 OR 0.5 MG/DOSE,) 2 MG/3ML SOPN [id number] Yes Inject 0.25 mg into the skin once a week.  Patient taking differently: Inject 1 mg into the skin once a week.   Charlton Amor, DO Taking Active            Med Note Littie Deeds, Brunetta Newingham A   Fri Jul 24, 2023 11:13 AM) PAP  spironolactone (ALDACTONE) 25 MG tablet [id number]  Take 1 tablet (25 mg total) by mouth daily. Charlton Amor, DO  Active   tamsulosin (FLOMAX) 0.4 MG CAPS capsule [id number]  TAKE 1 CAPSULE BY MOUTH DAILY Morey Hummingbird  S, DO  Active            Assessment/Plan:   Diabetes: - Currently {CHL Controlled/Uncontrolled:[phone]} - Reviewed long term cardiovascular and renal outcomes of uncontrolled blood sugar - Reviewed goal A1c, goal fasting, and goal 2 hour post prandial glucose - Reviewed dietary modifications including *** - Reviewed lifestyle modifications including: - Recommend to ***  - Recommend to check glucose *** - Meets financial criteria for *** patient assistance program through ***. Will collaborate with provider, CPhT, and patient to pursue assistance.   Hyperlipidemia/ASCVD Risk Reduction: - Currently {CHL Controlled/Uncontrolled:[phone]}.  - Reviewed long term complications of uncontrolled cholesterol - Reviewed dietary recommendations including *** - Reviewed lifestyle recommendations including *** - Recommend to ***  - Meets financial criteria for *** patient assistance program through ***. Will collaborate with provider, CPhT, and patient to pursue assistance.   Follow Up Plan: ***  [name], PharmD, DPLA

## 2023-07-27 ENCOUNTER — Other Ambulatory Visit: Payer: Self-pay

## 2023-07-27 MED ORDER — SEMAGLUTIDE (1 MG/DOSE) 4 MG/3ML ~~LOC~~ SOPN: 1.0000 mg | PEN_INJECTOR | SUBCUTANEOUS | 0 refills | Status: DC

## 2023-07-27 NOTE — Progress Notes (Signed)
07/27/2023  Patient ID: Danny Montoya, male   DOB: 18-Nov-1950, 72 y.o.   MRN: [id number]+  Patient outreach to follow-up with patient regarding Ozempic and fenofibrate.  -Patient would like 1 month supply of Ozempic 1mg  to be sent to [facility]; I will pend a prescription for [name] to sign and include the free 30 day voucher processing information from [employer] -Informed patient that PAP supply should arrive at [facility] in 10-14 business days, also -Patient stated that he realized he IS taking fenofibrate after we got off of our call last week.  Dr. Jens Som had stated it was okay for patient to not take if he had not been.  Advised he continue use based on recent TG levels and fact that he has continued to take.  I will inform Dr. Jens Som of this.  Patient sees [name] 10/31, and I will schedule follow-up telephone visit based on that appointment.

## 2023-07-28 ENCOUNTER — Ambulatory Visit: Payer: Medicare Other | Admitting: Family Medicine

## 2023-07-28 DIAGNOSIS — Z0000 Encounter for general adult medical examination without abnormal findings: Secondary | ICD-10-CM | POA: Diagnosis not present

## 2023-07-28 MED ORDER — SEMAGLUTIDE (1 MG/DOSE) 4 MG/3ML ~~LOC~~ SOPN: 1.0000 mg | PEN_INJECTOR | SUBCUTANEOUS | 0 refills | Status: DC

## 2023-07-28 NOTE — Progress Notes (Signed)
07/28/2023  Patient ID: Danny Montoya, male   DOB: [birth year], 72 y.o.   MRN: [id number]  Contacted [facility] to verify they were able to process the Ozempic 1mg  order under the free 1 month voucher provided by [employer] PAP.  They received the order and are able to process for a $0 copay but state the guaranteed delivery date is not until 10/31.  Patient has already missed one dose, so sending a MyChart message to see if he would like this sent to a different pharmacy where he could pick it up sooner.  [name], PharmD, DPLA

## 2023-07-28 NOTE — Patient Instructions (Addendum)
MEDICARE ANNUAL WELLNESS VISIT Health Maintenance Summary and Written Plan of Care  Danny Montoya ,  Thank you for allowing me to perform your Medicare Annual Wellness Visit and for your ongoing commitment to your health.   Health Maintenance & Immunization History Health Maintenance  Topic Date Due   COVID-19 Vaccine (1 - 2023-24 season) 08/13/2023 (Originally 06/07/2023)   Pneumonia Vaccine 63+ Years old (2 of 2 - PCV) 10/10/2023 (Originally 09/21/2019)   INFLUENZA VACCINE  01/04/2024 (Originally 05/07/2023)   Zoster Vaccines- Shingrix (1 of 2) 01/08/2024 (Originally 06/13/2003)   Hepatitis C Screening  02/26/2024 (Originally 06/13/1971)   Fecal DNA (Cologuard)  07/27/2024 (Originally 06/12/1998)   Diabetic kidney evaluation - Urine ACR  09/12/2023   FOOT EXAM  12/19/2023   HEMOGLOBIN A1C  01/07/2024   OPHTHALMOLOGY EXAM  01/22/2024   Diabetic kidney evaluation - eGFR measurement  07/08/2024   Medicare Annual Wellness (AWV)  07/27/2024   DTaP/Tdap/Td (2 - Td or Tdap) 09/11/2026   HPV VACCINES  Aged Out   Immunization History  Administered Date(s) Administered   Influenza-Unspecified 08/04/2013   Pneumococcal Polysaccharide-23 04/20/2009, 09/20/2018   Tdap 09/11/2016    These are the patient goals that we discussed:  Goals Addressed               This Visit's Progress     Patient Stated (pt-stated)        Patient stated that he would like to get more energy and more desire to get things done.         This is a list of Health Maintenance Items that are overdue or due now: Cologuard- per patient, completed in 2023 with negative results.  Pneumonia vaccine - patient declined Influenza vaccine - patient declined Shingles vaccine - patient declined  Orders/Referrals Placed Today: No orders of the defined types were placed in this encounter.  (Contact our referral department at [phone] if you have not spoken with someone about your referral appointment within the next 5  days)    Follow-up Plan Follow-up with Danny Amor, DO as planned Medicare wellness visit in one year.  Patient will access AVS on my chart.      Health Maintenance, Male Adopting a healthy lifestyle and getting preventive care are important in promoting health and wellness. Ask your health care provider about: The right schedule for you to have regular tests and exams. Things you can do on your own to prevent diseases and keep yourself healthy. What should I know about diet, weight, and exercise? Eat a healthy diet  Eat a diet that includes plenty of vegetables, fruits, low-fat dairy products, and lean protein. Do not eat a lot of foods that are high in solid fats, added sugars, or sodium. Maintain a healthy weight Body mass index (BMI) is a measurement that can be used to identify possible weight problems. It estimates body fat based on height and weight. Your health care provider can help determine your BMI and help you achieve or maintain a healthy weight. Get regular exercise Get regular exercise. This is one of the most important things you can do for your health. Most adults should: Exercise for at least 150 minutes each week. The exercise should increase your heart rate and make you sweat (moderate-intensity exercise). Do strengthening exercises at least twice a week. This is in addition to the moderate-intensity exercise. Spend less time sitting. Even light physical activity can be beneficial. Watch cholesterol and blood lipids Have your blood tested for  lipids and cholesterol at 70 years of age, then have this test every 5 years. You may need to have your cholesterol levels checked more often if: Your lipid or cholesterol levels are high. You are older than 70 years of age. You are at high risk for heart disease. What should I know about cancer screening? Many types of cancers can be detected early and may often be prevented. Depending on your health history and family  history, you may need to have cancer screening at various ages. This may include screening for: Colorectal cancer. Prostate cancer. Skin cancer. Lung cancer. What should I know about heart disease, diabetes, and high blood pressure? Blood pressure and heart disease High blood pressure causes heart disease and increases the risk of stroke. This is more likely to develop in people who have high blood pressure readings or are overweight. Talk with your health care provider about your target blood pressure readings. Have your blood pressure checked: Every 3-5 years if you are 16-33 years of age. Every year if you are 58 years old or older. If you are between the ages of 40 and 52 and are a current or former smoker, ask your health care provider if you should have a one-time screening for abdominal aortic aneurysm (AAA). Diabetes Have regular diabetes screenings. This checks your fasting blood sugar level. Have the screening done: Once every three years after age 3 if you are at a normal weight and have a low risk for diabetes. More often and at a younger age if you are overweight or have a high risk for diabetes. What should I know about preventing infection? Hepatitis B If you have a higher risk for hepatitis B, you should be screened for this virus. Talk with your health care provider to find out if you are at risk for hepatitis B infection. Hepatitis C Blood testing is recommended for: Everyone born from [birth year] through 1965. Anyone with known risk factors for hepatitis C. Sexually transmitted infections (STIs) You should be screened each year for STIs, including gonorrhea and chlamydia, if: You are sexually active and are younger than 70 years of age. You are older than 70 years of age and your health care provider tells you that you are at risk for this type of infection. Your sexual activity has changed since you were last screened, and you are at increased risk for chlamydia or  gonorrhea. Ask your health care provider if you are at risk. Ask your health care provider about whether you are at high risk for HIV. Your health care provider may recommend a prescription medicine to help prevent HIV infection. If you choose to take medicine to prevent HIV, you should first get tested for HIV. You should then be tested every 3 months for as long as you are taking the medicine. Follow these instructions at home: Alcohol use Do not drink alcohol if your health care provider tells you not to drink. If you drink alcohol: Limit how much you have to 0-2 drinks a day. Know how much alcohol is in your drink. In the [place] one drink equals one 12 oz bottle of beer (355 mL), one 5 oz glass of wine (148 mL), or one 1 oz glass of hard liquor (44 mL). Lifestyle Do not use any products that contain nicotine or tobacco. These products include cigarettes, chewing tobacco, and vaping devices, such as e-cigarettes. If you need help quitting, ask your health care provider. Do not use street drugs. Do not share  needles. Ask your health care provider for help if you need support or information about quitting drugs. General instructions Schedule regular health, dental, and eye exams. Stay current with your vaccines. Tell your health care provider if: You often feel depressed. You have ever been abused or do not feel safe at home. Summary Adopting a healthy lifestyle and getting preventive care are important in promoting health and wellness. Follow your health care provider's instructions about healthy diet, exercising, and getting tested or screened for diseases. Follow your health care provider's instructions on monitoring your cholesterol and blood pressure. This information is not intended to replace advice given to you by your health care provider. Make sure you discuss any questions you have with your health care provider. Document Revised: 02/11/2021 Document Reviewed: 02/11/2021 Elsevier  Patient Education  2024 [employer].

## 2023-07-28 NOTE — Progress Notes (Signed)
07/28/2023  Patient ID: Danny Montoya, male   DOB: 1951/09/22, 72 y.o.   MRN: [id number]  Patient would like 1 month supply of Ozempic 1mg  sent to [facility] in [place] since mail order pharmacy would not get it to him until 10/31.  Order pending for [name] to sign.  I will contact pharmacy once signed to verify going through on free voucher from [employer] P[employer].  [name], PharmD, DPLA

## 2023-07-28 NOTE — Progress Notes (Signed)
MEDICARE ANNUAL WELLNESS VISIT  07/28/2023  Telephone Visit Disclaimer This Medicare AWV was conducted by telephone due to national recommendations for restrictions regarding the COVID-19 Pandemic (e.g. social distancing).  I verified, using two identifiers, that I am speaking with Danny Montoya or their authorized healthcare agent. I discussed the limitations, risks, security, and privacy concerns of performing an evaluation and management service by telephone and the potential availability of an in-person appointment in the future. The patient expressed understanding and agreed to proceed.  Location of Patient: Home Location of Provider (nurse):  In the office.  Subjective:    Danny Montoya is a 72 y.o. male patient of Wachs, Erika S, DO who had a Medicare Annual Wellness Visit today via telephone. Danny Montoya is Retired and lives alone. he does not have any children. he reports that he is socially active and does interact with friends/family regularly. he is minimally physically active and enjoys working on cars but hasn't been able to do it lately.  Patient Care Team: Charlton Amor, DO as PCP - General (Family Medicine) [name], [facility] (Pharmacist)     07/28/2023    9:15 AM 06/23/2023    8:23 AM 09/11/2022   11:53 AM 05/21/2022   11:00 AM  Advanced Directives  Does Patient Have a Medical Advance Directive? No No No No  Would patient like information on creating a medical advance directive? No - Patient declined No - Patient declined No - Patient declined     Hospital Utilization Over the Past 12 Months: # of hospitalizations or ER visits: 0 # of surgeries: 1  Review of Systems    Patient reports that his overall health is worse compared to last year.  History obtained from chart review and the patient  Patient Reported Readings (BP, Pulse, CBG, Weight, etc) none Per patient no change in vitals since last visit, unable to obtain new vitals due to telehealth visit  Pain  Assessment Pain : 0-10 Pain Score: 7  Pain Type: Chronic pain Pain Location: Knee Pain Orientation: Left, Right Pain Descriptors / Indicators: Burning, Aching Pain Onset: More than a month ago Pain Frequency: Intermittent Pain Relieving Factors: rest  Pain Relieving Factors: rest  Current Medications & Allergies (verified) Allergies as of 07/28/2023       Reactions   Penicillins    When he was a child        Medication List        Accurate as of July 28, 2023  9:30 AM. If you have any questions, ask your nurse or doctor.          amLODipine 10 MG tablet Commonly known as: NORVASC TAKE 1 TABLET BY MOUTH DAILY   aspirin EC 81 MG tablet Take 1 tablet (81 mg total) by mouth daily. Swallow whole.   atorvastatin 80 MG tablet Commonly known as: LIPITOR Take 80 mg by mouth daily.   B-D INS SYR ULTRAFINE .5CC/29G 29G X 1/2" 0.5 ML Misc Generic drug: INSULIN SYRINGE .5CC/29G by Does not apply route.   chlorthalidone 25 MG tablet Commonly known as: HYGROTON TAKE 2 TABLETS BY MOUTH DAILY   escitalopram 10 MG tablet Commonly known as: Lexapro Take 1 tablet (10 mg total) by mouth at bedtime.   ezetimibe 10 MG tablet Commonly known as: ZETIA Take 1 tablet (10 mg total) by mouth daily.   fenofibrate 160 MG tablet   FreeStyle Libre 14 Day Reader Hardie Pulley by Does not apply route.   FreeStyle [employer]  2 Sensor Misc PLACE ON SKIN AND CHANGE EVERY  14 DAYS   furosemide 20 MG tablet Commonly known as: LASIX TAKE 1 TABLET BY MOUTH DAILY   gabapentin 300 MG capsule Commonly known as: NEURONTIN TAKE 1 CAPSULE BY MOUTH 3 TIMES  DAILY   glucose blood test strip 1 each by Other route as needed for other. Use as instructed   HYDROcodone-acetaminophen 10-325 MG tablet Commonly known as: NORCO Take 1 tablet by mouth every 8 (eight) hours as needed.   lisinopril 40 MG tablet Commonly known as: ZESTRIL TAKE 1 TABLET BY MOUTH DAILY   metFORMIN 1000 MG tablet Commonly  known as: GLUCOPHAGE Take 1,000 mg by mouth daily with breakfast.   NovoLOG FlexPen 100 UNIT/ML FlexPen Generic drug: insulin aspart Inject 40 Units into the skin 3 (three) times daily with meals.   prazosin 1 MG capsule Commonly known as: MINIPRESS TAKE 1 CAPSULE BY MOUTH TWICE  DAILY   Semaglutide (1 MG/DOSE) 4 MG/3ML Sopn Inject 1 mg as directed once a week. Please apply [employer] provided voucher for no-cost 1 month fill: Spero Geralds, [place] [id number], Grp [id number]   spironolactone 25 MG tablet Commonly known as: ALDACTONE Take 1 tablet (25 mg total) by mouth daily.   tamsulosin 0.4 MG Caps capsule Commonly known as: FLOMAX TAKE 1 CAPSULE BY MOUTH DAILY   Tresiba FlexTouch 100 UNIT/ML FlexTouch Pen Generic drug: insulin degludec Inject 30 Units into the skin daily.        History (reviewed): Past Medical History:  Diagnosis Date   Diabetes mellitus (HCC)    Gastroesophageal reflux disease    Hyperlipidemia    Hypertension    Obstructive sleep apnea    Past Surgical History:  Procedure Laterality Date   IR ANGIOGRAM EXTREMITY LEFT  06/23/2023   IR ANGIOGRAM SELECTIVE EACH ADDITIONAL VESSEL  06/23/2023   IR EMBO ARTERIAL NOT HEMORR HEMANG INC GUIDE ROADMAPPING  06/23/2023   IR RADIOLOGIST EVAL & MGMT  05/04/2023   IR RADIOLOGIST EVAL & MGMT  07/22/2023   IR US GUIDE VASC ACCESS RIGHT  06/23/2023   NECK SURGERY     Family History  Problem Relation Age of Onset   Coronary artery disease Mother    Coronary artery disease Sister    Social History   Socioeconomic History   Marital status: Single    Spouse name: Not on file   Number of children: 0   Years of education: 12   Highest education level: 12th grade  Occupational History   Occupation: Retired.  Tobacco Use   Smoking status: Never   Smokeless tobacco: Never  Vaping Use   Vaping status: Never Used  Substance and Sexual Activity   Alcohol use: Yes   Drug use: Not Currently   Sexual activity:  Not on file  Other Topics Concern   Not on file  Social History Narrative   Lives alone. He enjoys working on cars but hasn't been able to do it lately.   Social Determinants of Health   Financial Resource Strain: Low Risk  (07/28/2023)   Overall Financial Resource Strain (CARDIA)    Difficulty of Paying Living Expenses: Not hard at all  Food Insecurity: No Food Insecurity (07/28/2023)   Hunger Vital Sign    Worried About Running Out of Food in the Last Year: Never true    Ran Out of Food in the Last Year: Never true  Transportation Needs: No Transportation Needs (07/28/2023)   PRAPARE - Transportation  Lack of Transportation (Medical): No    Lack of Transportation (Non-Medical): No  Physical Activity: Sufficiently Active (07/28/2023)   Exercise Vital Sign    Days of Exercise per Week: 7 days    Minutes of Exercise per Session: 30 min  Stress: No Stress Concern Present (07/28/2023)   [employer] of Occupational Health - Occupational Stress Questionnaire    Feeling of Stress : Not at all  Social Connections: Socially Isolated (07/28/2023)   Social Connection and Isolation Panel [NHANES]    Frequency of Communication with Friends and Family: Never    Frequency of Social Gatherings with Friends and Family: Never    Attends Religious Services: Never    Database administrator or Organizations: No    Attends Banker Meetings: Never    Marital Status: Divorced    Activities of Daily Living    07/28/2023    9:17 AM  In your present state of health, do you have any difficulty performing the following activities:  Hearing? 1  Comment some hearing loss  Vision? 0  Difficulty concentrating or making decisions? 0  Walking or climbing stairs? 1  Comment some difficulty walking and with stairs. uses a cane.  Dressing or bathing? 0  Doing errands, shopping? 0  Preparing Food and eating ? N  Using the Toilet? N  In the past six months, have you accidently leaked  urine? N  Do you have problems with loss of bowel control? Y  Managing your Medications? N  Managing your Finances? N  Housekeeping or managing your Housekeeping? Y    Patient Education/ Literacy How often do you need to have someone help you when you read instructions, pamphlets, or other written materials from your doctor or pharmacy?: 1 - Never What is the last grade level you completed in school?: 12th grade  Exercise    Diet Patient reports consuming 3 meals a day and 1-2 snack(s) a day Patient reports that his primary diet is: Regular Patient reports that she does have regular access to food.   Depression Screen    07/28/2023    9:10 AM 05/28/2023    1:48 PM 09/11/2022   11:54 AM  PHQ 2/9 Scores  PHQ - 2 Score 4 5 2   PHQ- 9 Score 10 10 6      Fall Risk    07/28/2023    9:10 AM 12/19/2022   11:32 AM 11/20/2022   11:08 AM 09/11/2022   11:53 AM  Fall Risk   Falls in the past year? 0 1 0 1  Number falls in past yr: 0 0 0 0  Injury with Fall? 0 0 0 1  Risk for fall due to : Impaired mobility;Impaired balance/gait No Fall Risks No Fall Risks Impaired balance/gait;Impaired mobility  Follow up Falls evaluation completed;Education provided;Falls prevention discussed Falls evaluation completed Falls evaluation completed Falls evaluation completed     Objective:  Anne Maneval seemed alert and oriented and he participated appropriately during our telephone visit.  Blood Pressure Weight BMI  BP Readings from Last 3 Encounters:  07/09/23 122/65  07/06/23 (!) 98/58  06/23/23 (!) 143/75   Wt Readings from Last 3 Encounters:  07/09/23 (!) 308 lb 12 oz (140 kg)  07/06/23 299 lb (135.6 kg)  06/23/23 (!) 322 lb 15.6 oz (146.5 kg)   BMI Readings from Last 1 Encounters:  07/09/23 40.73 kg/m    *Unable to obtain current vital signs, weight, and BMI due to telephone visit type  Hearing/Vision  Cruze did not seem to have difficulty with hearing/understanding during the telephone  conversation Reports that he has not had a formal eye exam by an eye care professional within the past year Reports that he has not had a formal hearing evaluation within the past year *Unable to fully assess hearing and vision during telephone visit type  Cognitive Function:    07/28/2023    9:23 AM  6CIT Screen  What Year? 0 points  What month? 0 points  What time? 0 points  Count back from 20 0 points  Months in reverse 4 points  Repeat phrase 0 points  Total Score 4 points   (Normal:0-7, Significant for Dysfunction: >8)  Normal Cognitive Function Screening: Yes   Immunization & Health Maintenance Record Immunization History  Administered Date(s) Administered   Influenza-Unspecified 08/04/2013   Pneumococcal Polysaccharide-23 04/20/2009, 09/20/2018   Tdap 09/11/2016    Health Maintenance  Topic Date Due   COVID-19 Vaccine (1 - 2023-24 season) 08/13/2023 (Originally 06/07/2023)   Pneumonia Vaccine 79+ Years old (2 of 2 - PCV) 10/10/2023 (Originally 09/21/2019)   INFLUENZA VACCINE  01/04/2024 (Originally 05/07/2023)   Zoster Vaccines- Shingrix (1 of 2) 01/08/2024 (Originally 06/13/2003)   Hepatitis C Screening  02/26/2024 (Originally 06/13/1971)   Fecal DNA (Cologuard)  07/27/2024 (Originally 06/12/1998)   Diabetic kidney evaluation - Urine ACR  09/12/2023   FOOT EXAM  12/19/2023   HEMOGLOBIN A1C  01/07/2024   OPHTHALMOLOGY EXAM  01/22/2024   Diabetic kidney evaluation - eGFR measurement  07/08/2024   Medicare Annual Wellness (AWV)  07/27/2024   DTaP/Tdap/Td (2 - Td or Tdap) 09/11/2026   HPV VACCINES  Aged Out       Assessment  This is a routine wellness examination for Danny Montoya.  Health Maintenance: Due or Overdue There are no preventive care reminders to display for this patient.   Danny Montoya does not need a referral for Community Assistance: Care Management:   no Social Work:    no Prescription Assistance:  no Nutrition/Diabetes Education:  no   Plan:   Personalized Goals  Goals Addressed               This Visit's Progress     Patient Stated (pt-stated)        Patient stated that he would like to get more energy and more desire to get things done.       Personalized Health Maintenance & Screening Recommendations  Cologuard- per patient, completed in 2023 with negative results.  Pneumonia vaccine - patient declined Influenza vaccine - patient declined Shingles vaccine - patient declined  Lung Cancer Screening Recommended: no (Low Dose CT Chest recommended if Age 46-80 years, 20 pack-year currently smoking OR have quit w/in past 15 years) Hepatitis C Screening recommended: yes HIV Screening recommended: no  Advanced Directives: Written information was not prepared per patient's request.  Referrals & Orders No orders of the defined types were placed in this encounter.   Follow-up Plan Follow-up with Charlton Amor, DO as planned Medicare wellness visit in one year.  Patient will access AVS on my chart.   I have personally reviewed and noted the following in the patient's chart:   Medical and social history Use of alcohol, tobacco or illicit drugs  Current medications and supplements Functional ability and status Nutritional status Physical activity Advanced directives List of other physicians Hospitalizations, surgeries, and ER visits in previous 12 months Vitals Screenings to include cognitive, depression, and falls Referrals and appointments  In addition, I have reviewed and discussed with Danny Montoya certain preventive protocols, quality metrics, and best practice recommendations. A written personalized care plan for preventive services as well as general preventive health recommendations is available and can be mailed to the patient at his request.      Modesto Charon, RN BSN  07/28/2023

## 2023-07-28 NOTE — Addendum Note (Signed)
Addended by: Sabino Niemann A on: 07/28/2023 02:19 PM   Modules accepted: Orders

## 2023-07-29 NOTE — Progress Notes (Signed)
07/29/2023  Patient ID: Danny Montoya, male   DOB: 12/05/1950, 72 y.o.   MRN: [id number]  Order sent to [facility] in [place] per patient request.  Contacted pharmacy to verify order received and going through on 30 day voucher provided by [employer] P[employer].  Medication is there and ready for Danny Montoya to pick up at $0.  Sending MyChart message to notify him and contacting [facility] to discontinue order previously sent to them, so this does not unintentionally get filled and billed to insurance.   [name], PharmD, DPLA

## 2023-08-04 ENCOUNTER — Telehealth: Payer: Self-pay

## 2023-08-04 NOTE — Telephone Encounter (Signed)
Forwarding to [place] as an Financial planner.  Tiffany  [employer] PAP shipment for Ozempic 1 mg dose / 3 boxes received this morning. Please contact the patient to come and pick up their order today. Placed in the PAP fridge with patient identifier. Thanks in advance.   NDC: [id number] LOT: [id number] EXP: 2026-02-02

## 2023-08-06 ENCOUNTER — Ambulatory Visit: Payer: Medicare Other | Admitting: Family Medicine

## 2023-08-06 ENCOUNTER — Encounter: Payer: Self-pay | Admitting: Family Medicine

## 2023-08-06 ENCOUNTER — Other Ambulatory Visit: Payer: Self-pay | Admitting: Family Medicine

## 2023-08-06 VITALS — BP 113/74 | HR 95 | Ht 73.0 in | Wt 308.8 lb

## 2023-08-06 DIAGNOSIS — F321 Major depressive disorder, single episode, moderate: Secondary | ICD-10-CM

## 2023-08-06 DIAGNOSIS — E1165 Type 2 diabetes mellitus with hyperglycemia: Secondary | ICD-10-CM

## 2023-08-06 NOTE — Progress Notes (Signed)
Established patient visit   Patient: Danny Montoya   DOB: January 24, 1951   72 y.o. Male  MRN: [id number] Visit Date: 08/06/2023  Today's healthcare provider: Charlton Amor, DO   Chief Complaint  Patient presents with   Medical Management of Chronic Issues    MDD    SUBJECTIVE    Chief Complaint  Patient presents with   Medical Management of Chronic Issues    MDD   HPI HPI     Medical Management of Chronic Issues    Additional comments: MDD      Last edited by Roselyn Reef, CMA on 08/06/2023 11:32 AM.       Pt presents for MDD follow up. He says a lot of his depression is related to knee pain.    Review of Systems  Constitutional:  Negative for activity change, fatigue and fever.  Respiratory:  Negative for cough and shortness of breath.   Cardiovascular:  Negative for chest pain.  Gastrointestinal:  Negative for abdominal pain.  Genitourinary:  Negative for difficulty urinating.       Current Meds  Medication Sig   amLODipine (NORVASC) 10 MG tablet TAKE 1 TABLET BY MOUTH DAILY   aspirin EC 81 MG tablet Take 1 tablet (81 mg total) by mouth daily. Swallow whole.   atorvastatin (LIPITOR) 80 MG tablet Take 80 mg by mouth daily.   chlorthalidone (HYGROTON) 25 MG tablet TAKE 2 TABLETS BY MOUTH DAILY   Continuous Blood Gluc Receiver (FREESTYLE LIBRE 14 DAY READER) DEVI by Does not apply route.   Continuous Blood Gluc Sensor (FREESTYLE LIBRE 2 SENSOR) MISC PLACE ON SKIN AND CHANGE EVERY  14 DAYS   escitalopram (LEXAPRO) 10 MG tablet Take 1 tablet (10 mg total) by mouth at bedtime.   ezetimibe (ZETIA) 10 MG tablet Take 1 tablet (10 mg total) by mouth daily.   fenofibrate 160 MG tablet    furosemide (LASIX) 20 MG tablet TAKE 1 TABLET BY MOUTH DAILY   gabapentin (NEURONTIN) 300 MG capsule TAKE 1 CAPSULE BY MOUTH 3 TIMES  DAILY   glucose blood test strip 1 each by Other route as needed for other. Use as instructed   HYDROcodone-acetaminophen (NORCO) 10-325 MG tablet Take  1 tablet by mouth every 8 (eight) hours as needed.   insulin aspart (NOVOLOG FLEXPEN) 100 UNIT/ML FlexPen Inject 40 Units into the skin 3 (three) times daily with meals.   insulin degludec (TRESIBA FLEXTOUCH) 100 UNIT/ML FlexTouch Pen Inject 30 Units into the skin daily.   INSULIN SYRINGE .5CC/29G (B-D INS SYR ULTRAFINE .5CC/29G) 29G X 1/2" 0.5 ML MISC by Does not apply route.   lisinopril (ZESTRIL) 40 MG tablet TAKE 1 TABLET BY MOUTH DAILY   metFORMIN (GLUCOPHAGE) 1000 MG tablet Take 1,000 mg by mouth daily with breakfast.   prazosin (MINIPRESS) 1 MG capsule TAKE 1 CAPSULE BY MOUTH TWICE  DAILY   Semaglutide, 1 MG/DOSE, 4 MG/3ML SOPN Inject 1 mg as directed once a week. Please apply [facility] provided voucher for no-cost 1 month fill: [place], [place] [id number], Grp [id number]   spironolactone (ALDACTONE) 25 MG tablet Take 1 tablet (25 mg total) by mouth daily.   tamsulosin (FLOMAX) 0.4 MG CAPS capsule TAKE 1 CAPSULE BY MOUTH DAILY    OBJECTIVE    BP 113/74 (BP Location: Left Arm, Patient Position: Sitting, Cuff Size: Large)   Pulse 95   Ht 6\' 1"  (1.854 m)   Wt (!) 308 lb 12 oz (140 kg)  SpO2 95%   BMI 40.73 kg/m   Physical Exam Vitals reviewed.  Constitutional:      Appearance: He is well-developed.  HENT:     Head: Normocephalic and atraumatic.  Eyes:     Conjunctiva/sclera: Conjunctivae normal.  Cardiovascular:     Rate and Rhythm: Normal rate.  Pulmonary:     Effort: Pulmonary effort is normal.  Skin:    General: Skin is dry.     Coloration: Skin is not pale.  Neurological:     Mental Status: He is alert and oriented to person, place, and time.  Psychiatric:        Behavior: Behavior normal.        ASSESSMENT & PLAN    Problem List Items Addressed This Visit       Other   Depression, major, single episode, moderate (HCC) - Primary    Pt says his depression is ok. He is still feeling down and this is likely related to his weight and knee pain. We  discussed watching diet intake and trying to lose some weight. BMI is just on the border of 40 which he will need <40 for knee replacement.         Return in about 3 months (around 11/06/2023).      No orders of the defined types were placed in this encounter.   No orders of the defined types were placed in this encounter.    Charlton Amor, DO  [facility] at [facility] [phone] (phone) [phone] (fax)  [facility]

## 2023-08-06 NOTE — Assessment & Plan Note (Signed)
Pt says his depression is ok. He is still feeling down and this is likely related to his weight and knee pain. We discussed watching diet intake and trying to lose some weight. BMI is just on the border of 40 which he will need <40 for knee replacement.

## 2023-08-10 ENCOUNTER — Ambulatory Visit (HOSPITAL_BASED_OUTPATIENT_CLINIC_OR_DEPARTMENT_OTHER)
Admission: RE | Admit: 2023-08-10 | Discharge: 2023-08-10 | Disposition: A | Discharge status: Home / Self Care | Payer: Medicare Other | Source: Ambulatory Visit | Attending: Cardiology | Admitting: Cardiology

## 2023-08-10 DIAGNOSIS — R0609 Other forms of dyspnea: Secondary | ICD-10-CM

## 2023-08-10 DIAGNOSIS — R9431 Abnormal electrocardiogram [ECG] [EKG]: Secondary | ICD-10-CM | POA: Diagnosis not present

## 2023-08-10 DIAGNOSIS — I7121 Aneurysm of the ascending aorta, without rupture: Secondary | ICD-10-CM | POA: Diagnosis not present

## 2023-08-10 DIAGNOSIS — I700 Atherosclerosis of aorta: Secondary | ICD-10-CM | POA: Diagnosis not present

## 2023-08-10 LAB — ECHOCARDIOGRAM COMPLETE
AR max vel: 2.32 cm2
AV Area VTI: 2.7 cm2
AV Area mean vel: 2.74 cm2
AV Mean grad: 9 mm[Hg]
AV Peak grad: 17.9 mm[Hg]
Ao pk vel: 2.12 m/s
Area-P 1/2: 3.34 cm2
Calc EF: 62.7 %
MV M vel: 1.68 m/s
MV Peak grad: 11.2 mm[Hg]
P 1/2 time: 734 ms
S' Lateral: 2.6 cm
Single Plane A2C EF: 60.9 %
Single Plane A4C EF: 61.2 %

## 2023-08-12 ENCOUNTER — Telehealth (HOSPITAL_COMMUNITY): Payer: Self-pay | Admitting: Psychiatry

## 2023-08-25 ENCOUNTER — Other Ambulatory Visit (HOSPITAL_COMMUNITY): Payer: Medicare Other

## 2023-08-25 ENCOUNTER — Ambulatory Visit (HOSPITAL_COMMUNITY): Payer: Medicare Other

## 2023-08-27 ENCOUNTER — Ambulatory Visit (HOSPITAL_COMMUNITY): Payer: Self-pay | Admitting: Psychiatry

## 2023-09-10 ENCOUNTER — Other Ambulatory Visit: Payer: Self-pay

## 2023-09-10 NOTE — Progress Notes (Signed)
   09/10/2023  Patient ID: [name], male   DOB: 1951-01-15, 72 y.o.   MRN: [id number]  S/O Telephone visit to follow-up on control of T2DM  Diabetes Management -Current medications:  Ozempic 1mg , Novolog 40 units TID, Tresiba 30 units daily, metformin 1000mg  daily -Using [employer] 2 for CGM; BG average the past 14 days is 158 -A1c 07/09/23 was 7.4% -Patient has been on Ozempic 1mg  >4 weeks and is tolerating well with no adverse side effects.  States he took his last dose of this Monday and has not received from Nov[facility] PAP- last fill was in October when [facility] provided 1 time 30 day voucher to fill at retail pharmacy due to processing delay on their end -Patient endorses 1 instance of hypoglycemia with BG in the 50's, but he was not symptomatic -Enrolled in [facility] PAP for Ozempic, Novolog, and Tresiba- enrollment good through 10/06/2023  Medication Adherence -Patient endorses some recent challenges with remembering whether he has taken his medication yet or not, specifically insulin  A/P  Diabetes Management -Recommend increasing Ozempic to 2mg  weekly and decreasing insulin by 20%- Tresiba 24 units daily, Novolog 32 units TID -Continue metformin 1000mg  daily -I am submitting 2025 re-enrollment application for Ozempic 2mg , Tresiba, Novolog, and pen needles online; so patient continues to receive these from [facility] PAP -Patient sees [name] again 1/31 and will be due for A1c  Medication Adherence -Recommended that patient continue to use weekly pill organizer -Suggested the use of alarms/reminders on his phone or written in places he will see in his home -Making a dosing checklist specific to his medication regimen and sending to him, so he can check off daily doses once taken to see if this is beneficial  Follow-up:  6 weeks  [name], PharmD, DPLA

## 2023-09-30 ENCOUNTER — Other Ambulatory Visit: Payer: Self-pay | Admitting: Family Medicine

## 2023-10-03 ENCOUNTER — Ambulatory Visit
Admission: RE | Admit: 2023-10-03 | Discharge: 2023-10-03 | Disposition: A | Discharge status: Home / Self Care | Payer: Medicare Other | Source: Ambulatory Visit | Attending: Family Medicine | Admitting: Family Medicine

## 2023-10-03 ENCOUNTER — Other Ambulatory Visit: Payer: Self-pay

## 2023-10-03 VITALS — BP 134/82 | HR 95 | Temp 97.5°F | Resp 16

## 2023-10-03 DIAGNOSIS — B029 Zoster without complications: Secondary | ICD-10-CM | POA: Diagnosis not present

## 2023-10-03 DIAGNOSIS — R21 Rash and other nonspecific skin eruption: Secondary | ICD-10-CM

## 2023-10-03 MED ORDER — VALACYCLOVIR HCL 1 G PO TABS: 1000.0000 mg | ORAL_TABLET | Freq: Three times a day (TID) | ORAL | 0 refills | Status: DC

## 2023-10-03 NOTE — Discharge Instructions (Addendum)
Advised patient to take medications as directed with food to completion.  Directed patient to take daily for the next 7 days 3 to 4 hours apart.  Encouraged to increase daily water intake to 64 ounces per day while taking this medication.  Advised if symptoms worsen and/or unresolved please follow-up with PCP or here for further evaluation.

## 2023-10-03 NOTE — ED Provider Notes (Signed)
[facility]    CSN: [id number] Arrival date & time: 10/03/23  1153      History   Chief Complaint Chief Complaint  Patient presents with   Rash    HPI Danny Montoya is a 72 y.o. male.   HPI 72 year old male presents with rash of chest wrap to the back left side.  Patient is concerned with shingles.  Patient reports rash began 1 week ago.  PMH significant for morbid obesity, CKD stage III, and HTN.  Past Medical History:  Diagnosis Date   Diabetes mellitus (HCC)    Gastroesophageal reflux disease    Hyperlipidemia    Hypertension    Obstructive sleep apnea     Patient Active Problem List   Diagnosis Date Noted   Depression, major, single episode, moderate (HCC) 05/28/2023   Chronic kidney disease, stage 3a (HCC) 02/26/2023   Neck pain on right side 11/20/2022   Lower extremity edema 10/09/2022   Type 2 diabetes mellitus with hyperglycemia, with long-term current use of insulin (HCC) 09/11/2022   Nocturia 09/11/2022   Scrotal swelling 09/11/2022   Primary hypertension 09/11/2022   Class 3 severe obesity due to excess calories without serious comorbidity with body mass index (BMI) of 40.0 to 44.9 in adult (HCC) 09/11/2022   Sleep apnea 09/11/2022   Diabetic peripheral neuropathy associated with type 2 diabetes mellitus (HCC) 05/12/2022   Primary osteoarthritis, right shoulder 02/26/2022   Sensorineural hearing loss (SNHL) of both ears 07/24/2020   Primary osteoarthritis of both knees 09/11/2016    Past Surgical History:  Procedure Laterality Date   IR ANGIOGRAM EXTREMITY LEFT  06/23/2023   IR ANGIOGRAM SELECTIVE EACH ADDITIONAL VESSEL  06/23/2023   IR EMBO ARTERIAL NOT HEMORR HEMANG INC GUIDE ROADMAPPING  06/23/2023   IR RADIOLOGIST EVAL & MGMT  05/04/2023   IR RADIOLOGIST EVAL & MGMT  07/22/2023   IR US GUIDE VASC ACCESS RIGHT  06/23/2023   NECK SURGERY         Home Medications    Prior to Admission medications   Medication Sig Start Date End Date  Taking? Authorizing Provider  valACYclovir (VALTREX) 1000 MG tablet Take 1 tablet (1,000 mg total) by mouth 3 (three) times daily. 10/03/23  Yes Trevor Iha, FNP  amLODipine (NORVASC) 10 MG tablet TAKE 1 TABLET BY MOUTH DAILY 06/04/23   Lewayne Bunting, MD  aspirin EC 81 MG tablet Take 1 tablet (81 mg total) by mouth daily. Swallow whole. 07/20/20   Lewayne Bunting, MD  atorvastatin (LIPITOR) 80 MG tablet Take 80 mg by mouth daily. 01/27/20   [provider]  chlorthalidone (HYGROTON) 25 MG tablet TAKE 2 TABLETS BY MOUTH DAILY 01/20/23   Charlton Amor, DO  Continuous Blood Gluc Receiver (FREESTYLE LIBRE 14 DAY READER) DEVI by Does not apply route.    [provider]  Continuous Glucose Sensor (FREESTYLE LIBRE 2 SENSOR) MISC PLACE ON SKIN AND CHANGE EVERY  14 DAYS 08/07/23   Charlton Amor, DO  escitalopram (LEXAPRO) 10 MG tablet TAKE 1 TABLET BY MOUTH AT  BEDTIME 10/01/23   Charlton Amor, DO  ezetimibe (ZETIA) 10 MG tablet Take 1 tablet (10 mg total) by mouth daily. 07/23/23   Lewayne Bunting, MD  fenofibrate 160 MG tablet  01/27/20   [provider]  furosemide (LASIX) 20 MG tablet TAKE 1 TABLET BY MOUTH DAILY 08/07/23   Everrett Coombe, DO  gabapentin (NEURONTIN) 300 MG capsule TAKE 1 CAPSULE BY MOUTH 3  TIMES  DAILY 01/28/23   Morey Hummingbird S, DO  glucose blood test strip 1 each by Other route as needed for other. Use as instructed    [provider]  HYDROcodone-acetaminophen (NORCO) 10-325 MG tablet Take 1 tablet by mouth every 8 (eight) hours as needed. 05/28/23   Monica Becton, MD  insulin aspart (NOVOLOG FLEXPEN) 100 UNIT/ML FlexPen Inject 32 Units into the skin 3 (three) times daily with meals.    [provider]  insulin degludec (TRESIBA FLEXTOUCH) 100 UNIT/ML FlexTouch Pen Inject 24 Units into the skin daily.    [provider]  INSULIN SYRINGE .5CC/29G (B-D INS SYR ULTRAFINE .5CC/29G) 29G X 1/2" 0.5 ML MISC by Does not apply  route.    [provider]  lisinopril (ZESTRIL) 40 MG tablet TAKE 1 TABLET BY MOUTH DAILY 01/05/23   Morey Hummingbird S, DO  metFORMIN (GLUCOPHAGE) 1000 MG tablet Take 1,000 mg by mouth daily with breakfast. 12/23/19   [provider]  prazosin (MINIPRESS) 1 MG capsule TAKE 1 CAPSULE BY MOUTH TWICE  DAILY 01/23/23   Charlton Amor, DO  Semaglutide, 2 MG/DOSE, 8 MG/3ML SOPN Inject 2 mg into the skin once a week.    [provider]  spironolactone (ALDACTONE) 25 MG tablet TAKE 1 TABLET BY MOUTH DAILY 08/07/23   Charlton Amor, DO  tamsulosin (FLOMAX) 0.4 MG CAPS capsule TAKE 1 CAPSULE BY MOUTH DAILY 02/22/23   Charlton Amor, DO    Family History Family History  Problem Relation Age of Onset   Coronary artery disease Mother    Coronary artery disease Sister     Social History Social History   Tobacco Use   Smoking status: Never   Smokeless tobacco: Never  Vaping Use   Vaping status: Never Used  Substance Use Topics   Alcohol use: Yes    Alcohol/week: 21.0 standard drinks of alcohol    Types: 21 Shots of liquor per week   Drug use: Not Currently     Allergies   Penicillins   Review of Systems Review of Systems  Skin:  Positive for rash.  All other systems reviewed and are negative.    Physical Exam Triage Vital Signs ED Triage Vitals  Encounter Vitals Group     BP 10/03/23 1215 134/82     Systolic BP Percentile --      Diastolic BP Percentile --      Pulse Rate 10/03/23 1215 95     Resp 10/03/23 1215 16     Temp 10/03/23 1215 (!) 97.5 F (36.4 C)     Temp src --      SpO2 10/03/23 1215 98 %     Weight --      Height --      Head Circumference --      Peak Flow --      Pain Score 10/03/23 1214 7     Pain Loc --      Pain Education --      Exclude from Growth Chart --    No data found.  Updated Vital Signs BP 134/82   Pulse 95   Temp (!) 97.5 F (36.4 C)   Resp 16   SpO2 98%    Physical Exam Vitals and nursing note reviewed.   Constitutional:      Appearance: Normal appearance. He is obese. He is ill-appearing.  HENT:     Head: Normocephalic and atraumatic.     Mouth/Throat:  Mouth: Mucous membranes are moist.     Pharynx: Oropharynx is clear.  Eyes:     Extraocular Movements: Extraocular movements intact.     Conjunctiva/sclera: Conjunctivae normal.     Pupils: Pupils are equal, round, and reactive to light.  Cardiovascular:     Rate and Rhythm: Normal rate and regular rhythm.     Pulses: Normal pulses.     Heart sounds: Normal heart sounds.  Pulmonary:     Effort: Pulmonary effort is normal.     Breath sounds: Normal breath sounds. No wheezing, rhonchi or rales.  Musculoskeletal:        General: Normal range of motion.     Cervical back: Normal range of motion and neck supple.  Skin:    General: Skin is warm and dry.     Comments: Left sided anterior chest/left sided mid back: Numerous erythematous vesicular grouped lesions noted-please see images below  Neurological:     General: No focal deficit present.     Mental Status: He is alert and oriented to person, place, and time. Mental status is at baseline.  Psychiatric:        Mood and Affect: Mood normal.        Behavior: Behavior normal.         UC Treatments / Results  Labs (all labs ordered are listed, but only abnormal results are displayed) Labs Reviewed - No data to display  EKG   Radiology No results found.  Procedures Procedures (including critical care time)  Medications Ordered in UC Medications - No data to display  Initial Impression / Assessment and Plan / UC Course  I have reviewed the triage vital signs and the nursing notes.  Pertinent labs & imaging results that were available during my care of the patient were reviewed by me and considered in my medical decision making (see chart for details).     MDM: Herpes zoster without complication-Rx'd Valtrex 1 g tablet: Take 3 tablets by mouth daily x 7 days; 2.   Rash and nonspecific skin eruption-distinctive shingles vesicular rash Rx'd Valtrex 1 g tablet: Take 3 tablets by mouth daily x 7 days. Advised patient to take medications as directed with food to completion.  Directed patient to take daily for the next 7 days 3 to 4 hours apart.  Encouraged to increase daily water intake to 64 ounces per day while taking this medication.  Advised if symptoms worsen and/or unresolved please follow-up with PCP or here for further evaluation. Final Clinical Impressions(s) / UC Diagnoses   Final diagnoses:  Rash and nonspecific skin eruption  Herpes zoster without complication     Discharge Instructions      Advised patient to take medications as directed with food to completion.  Directed patient to take daily for the next 7 days 3 to 4 hours apart.  Encouraged to increase daily water intake to 64 ounces per day while taking this medication.  Advised if symptoms worsen and/or unresolved please follow-up with PCP or here for further evaluation.     ED Prescriptions     Medication Sig Dispense Auth. Provider   valACYclovir (VALTREX) 1000 MG tablet Take 1 tablet (1,000 mg total) by mouth 3 (three) times daily. 21 tablet Trevor Iha, FNP      PDMP not reviewed this encounter.   Trevor Iha, FNP 10/03/23 1238

## 2023-10-03 NOTE — ED Triage Notes (Signed)
Pt presents to uc with co of rash to chest wrapping around to back on the left side. Pt is concerned for shingles. Pt reports rash started 1 week ago. Reports burning sensation

## 2023-10-05 NOTE — Progress Notes (Signed)
   10/05/2023  Patient ID: Danny Montoya, male   DOB: [birth year], 72 y.o.   MRN: [id number]  Received in basket message from patient stating Novo PAP 2025 re-enrollment has not been approved.  Contacted Novo PAP, and automated system states patient and provider information are missing.  Per representative, they have actually received everything on their end.  Application has been approved through 10/05/2024, and medications should arrive at [facility] around the middle of January.  Sending [name] a MyChart message to let him know.  [name], PharmD, DPLA

## 2023-10-11 ENCOUNTER — Other Ambulatory Visit: Payer: Self-pay | Admitting: Family Medicine

## 2023-10-11 DIAGNOSIS — R351 Nocturia: Secondary | ICD-10-CM

## 2023-10-11 DIAGNOSIS — I10 Essential (primary) hypertension: Secondary | ICD-10-CM

## 2023-10-12 ENCOUNTER — Encounter: Payer: Self-pay | Admitting: *Deleted

## 2023-10-15 ENCOUNTER — Other Ambulatory Visit: Payer: Self-pay

## 2023-10-15 NOTE — Progress Notes (Signed)
   10/15/2023  Patient ID: Desiderio Eans, male   DOB: 09-30-1951, 73 y.o.   MRN: [id number]  S/O Telephone visit to follow-up on management of T2DM  Diabetes Management Plan -Current medications:  Ozempic  1mg  weekly, metformin  1000mg  daily, Tresiba 30 units daily, Novolog 40 units TID with meals -Patient approved for Novo PAP for 2025 to receive Ozempic  2mg , Tresiba, Novolog, and pen needles- these [facility] arrive at [facility] by the end of next week -Patient has 1 pen remaining of Ozampic 1mg  remaining -Using [employer] 2 for CGM; BG average the last 7 days is 165, last 14 and 30 days is 170 -Last A1c 7.4% 10/3 -Patient does not endorse any s/sx of hypoglycemia  A/P  Diabetes Management Plan -Once PAP shipment from [facility] arrives, increase to Ozempic  2mg  weekly and decrease Novolog to 32 units TID with meals and Tresiba to 24 units daily -Patient sees PCP 1/31 and will be due for A1c at that time  Follow-up:  4 weeks  [name], PharmD, DPLA

## 2023-11-01 ENCOUNTER — Encounter: Payer: Self-pay | Admitting: Family Medicine

## 2023-11-06 ENCOUNTER — Ambulatory Visit (INDEPENDENT_AMBULATORY_CARE_PROVIDER_SITE_OTHER): Payer: Medicare Other | Admitting: Family Medicine

## 2023-11-06 ENCOUNTER — Encounter: Payer: Self-pay | Admitting: Family Medicine

## 2023-11-06 VITALS — BP 92/60 | HR 111 | Ht 73.0 in | Wt 305.2 lb

## 2023-11-06 DIAGNOSIS — F321 Major depressive disorder, single episode, moderate: Secondary | ICD-10-CM | POA: Diagnosis not present

## 2023-11-06 DIAGNOSIS — I10 Essential (primary) hypertension: Secondary | ICD-10-CM

## 2023-11-06 DIAGNOSIS — E1165 Type 2 diabetes mellitus with hyperglycemia: Secondary | ICD-10-CM | POA: Diagnosis not present

## 2023-11-06 DIAGNOSIS — Z794 Long term (current) use of insulin: Secondary | ICD-10-CM

## 2023-11-06 MED ORDER — FUROSEMIDE 20 MG PO TABS: 20.0000 mg | ORAL_TABLET | Freq: Every day | ORAL | 2 refills | Status: DC

## 2023-11-06 MED ORDER — CHLORTHALIDONE 25 MG PO TABS: 50.0000 mg | ORAL_TABLET | Freq: Every day | ORAL | 3 refills | Status: DC

## 2023-11-06 MED ORDER — LISINOPRIL 40 MG PO TABS: 40.0000 mg | ORAL_TABLET | Freq: Every day | ORAL | 2 refills | Status: DC

## 2023-11-06 NOTE — Assessment & Plan Note (Signed)
Pt notes continued depression which sounds like it can be related to pain from his knee. He is on ozempic to help with weight loss to see if we can help.  - I did offer aquatic therapy because I feel like if we can get pain control we may be able to help his mood. He declines at this time.

## 2023-11-06 NOTE — Assessment & Plan Note (Signed)
-   bp on the lower end in clinic today which is abnormal for him. He does say he is in a lot of pain today and not feeling well. Is still recovering from the shingles which could play a role in hypotension. If this persists we will need to adjust bp medicine. Will have pt check bp and report back to [place] in a few days. - will get blood work to monitor electrolyte and kidney function

## 2023-11-06 NOTE — Assessment & Plan Note (Signed)
Have ordered A1c and lipid panel.  - currently on ozempic 2mg  and insulin and we are working with pharmacy on better diabetic control

## 2023-11-06 NOTE — Progress Notes (Signed)
Established patient visit   Patient: Danny Montoya   DOB: July 26, 1951   73 y.o. Male  MRN: [id number] Visit Date: 11/06/2023  Today's healthcare provider: Charlton Amor, DO   Chief Complaint  Patient presents with   Medical Management of Chronic Issues    DM last A1C 7.4    SUBJECTIVE    Chief Complaint  Patient presents with   Medical Management of Chronic Issues    DM last A1C 7.4   HPI HPI     Medical Management of Chronic Issues    Additional comments: DM last A1C 7.4      Last edited by Roselyn Reef, CMA on 11/06/2023 11:14 AM.      Pt presents for DM follow up and HTN follow up  Recently went to urgent care and was diagnosed with shingles. Has had a lot of pain. Is slowly improving.   DM - on ozempic 2mg   - novolog 32u TID  - tresiba 24u daily  - due for A1c check today   HTN -vitals show some hypotension today however he does report not feeling well   Review of Systems  Constitutional:  Negative for activity change, fatigue and fever.  Respiratory:  Negative for cough and shortness of breath.   Cardiovascular:  Negative for chest pain.  Gastrointestinal:  Negative for abdominal pain.  Genitourinary:  Negative for difficulty urinating.       Current Meds  Medication Sig   amLODipine (NORVASC) 10 MG tablet TAKE 1 TABLET BY MOUTH DAILY   aspirin EC 81 MG tablet Take 1 tablet (81 mg total) by mouth daily. Swallow whole.   atorvastatin (LIPITOR) 80 MG tablet Take 80 mg by mouth daily.   Continuous Blood Gluc Receiver (FREESTYLE LIBRE 14 DAY READER) DEVI by Does not apply route.   Continuous Glucose Sensor (FREESTYLE LIBRE 2 SENSOR) MISC PLACE ON SKIN AND CHANGE EVERY  14 DAYS   escitalopram (LEXAPRO) 10 MG tablet TAKE 1 TABLET BY MOUTH AT  BEDTIME   ezetimibe (ZETIA) 10 MG tablet Take 1 tablet (10 mg total) by mouth daily.   fenofibrate 160 MG tablet    gabapentin (NEURONTIN) 300 MG capsule TAKE 1 CAPSULE BY MOUTH 3 TIMES  DAILY   glucose blood test  strip 1 each by Other route as needed for other. Use as instructed   HYDROcodone-acetaminophen (NORCO) 10-325 MG tablet Take 1 tablet by mouth every 8 (eight) hours as needed.   insulin aspart (NOVOLOG FLEXPEN) 100 UNIT/ML FlexPen Inject 32 Units into the skin 3 (three) times daily with meals.   insulin degludec (TRESIBA FLEXTOUCH) 100 UNIT/ML FlexTouch Pen Inject 24 Units into the skin daily.   INSULIN SYRINGE .5CC/29G (B-D INS SYR ULTRAFINE .5CC/29G) 29G X 1/2" 0.5 ML MISC by Does not apply route.   metFORMIN (GLUCOPHAGE) 1000 MG tablet Take 1,000 mg by mouth daily with breakfast.   prazosin (MINIPRESS) 1 MG capsule TAKE 1 CAPSULE BY MOUTH TWICE  DAILY   Semaglutide, 2 MG/DOSE, 8 MG/3ML SOPN Inject 2 mg into the skin once a week.   spironolactone (ALDACTONE) 25 MG tablet TAKE 1 TABLET BY MOUTH DAILY   tamsulosin (FLOMAX) 0.4 MG CAPS capsule TAKE 1 CAPSULE BY MOUTH DAILY   [DISCONTINUED] chlorthalidone (HYGROTON) 25 MG tablet TAKE 2 TABLETS BY MOUTH DAILY   [DISCONTINUED] furosemide (LASIX) 20 MG tablet TAKE 1 TABLET BY MOUTH DAILY   [DISCONTINUED] lisinopril (ZESTRIL) 40 MG tablet TAKE 1 TABLET BY MOUTH DAILY  OBJECTIVE    BP 92/60 (BP Location: Left Arm, Patient Position: Sitting, Cuff Size: Large)   Pulse (!) 111   Ht 6\' 1"  (1.854 m)   Wt (!) 305 lb 3 oz (138.4 kg)   SpO2 98%   BMI 40.26 kg/m   Physical Exam Vitals and nursing note reviewed.  Constitutional:      General: He is not in acute distress.    Appearance: Normal appearance.  HENT:     Head: Normocephalic and atraumatic.     Right Ear: External ear normal.     Left Ear: External ear normal.     Nose: Nose normal.  Eyes:     Conjunctiva/sclera: Conjunctivae normal.  Cardiovascular:     Rate and Rhythm: Normal rate and regular rhythm.  Pulmonary:     Effort: Pulmonary effort is normal.     Breath sounds: Normal breath sounds.  Skin:    Comments: Has shingles rash   Neurological:     General: No focal deficit  present.     Mental Status: He is alert and oriented to person, place, and time.  Psychiatric:        Mood and Affect: Mood normal.        Behavior: Behavior normal.        Thought Content: Thought content normal.        Judgment: Judgment normal.        ASSESSMENT & PLAN    Problem List Items Addressed This Visit       Cardiovascular and Mediastinum   Primary hypertension   - bp on the lower end in clinic today which is abnormal for him. He does say he is in a lot of pain today and not feeling well. Is still recovering from the shingles which could play a role in hypotension. If this persists we will need to adjust bp medicine. Will have pt check bp and report back to [place] in a few days. - will get blood work to monitor electrolyte and kidney function      Relevant Medications   chlorthalidone (HYGROTON) 25 MG tablet   lisinopril (ZESTRIL) 40 MG tablet   furosemide (LASIX) 20 MG tablet   Other Relevant Orders   CMP14+EGFR     Endocrine   Type 2 diabetes mellitus with hyperglycemia, with long-term current use of insulin (HCC) - Primary   Have ordered A1c and lipid panel.  - currently on ozempic 2mg  and insulin and we are working with pharmacy on better diabetic control      Relevant Medications   lisinopril (ZESTRIL) 40 MG tablet   Other Relevant Orders   HgB A1c   CMP14+EGFR   Lipid panel     Other   Depression, major, single episode, moderate (HCC)   Pt notes continued depression which sounds like it can be related to pain from his knee. He is on ozempic to help with weight loss to see if we can help.  - I did offer aquatic therapy because I feel like if we can get pain control we may be able to help his mood. He declines at this time.        Return in about 3 months (around 02/03/2024) for DM follow up.      Meds ordered this encounter  Medications   chlorthalidone (HYGROTON) 25 MG tablet    Sig: Take 2 tablets (50 mg total) by mouth daily.    Dispense:  180  tablet    Refill:  3    Please send a replace/new response with 100-Day Supply if appropriate to maximize member benefit. Requesting 1 year supply.   lisinopril (ZESTRIL) 40 MG tablet    Sig: Take 1 tablet (40 mg total) by mouth daily.    Dispense:  100 tablet    Refill:  2    Please send a replace/new response with 100-Day Supply if appropriate to maximize member benefit. Requesting 1 year supply.   furosemide (LASIX) 20 MG tablet    Sig: Take 1 tablet (20 mg total) by mouth daily.    Dispense:  100 tablet    Refill:  2    Please send a replace/new response with 100-Day Supply if appropriate to maximize member benefit. Requesting 1 year supply.    Orders Placed This Encounter  Procedures   HgB A1c   CMP14+EGFR    Has the patient fasted?:   No   Lipid panel    Has the patient fasted?:   No    Release to patient:   Immediate     Charlton Amor, DO  [facility] at [facility] [phone] (phone) [phone] (fax)  [facility]

## 2023-11-07 LAB — CMP14+EGFR
ALT: 17 [IU]/L (ref 0–44)
AST: 16 [IU]/L (ref 0–40)
Albumin: 4.4 g/dL (ref 3.9–4.9)
Alkaline Phosphatase: 43 [IU]/L — ABNORMAL LOW (ref 44–121)
BUN/Creatinine Ratio: 14 (ref 10–24)
BUN: 22 mg/dL (ref 8–27)
Bilirubin Total: 0.4 mg/dL (ref 0.0–1.2)
CO2: 21 mmol/L (ref 20–29)
Calcium: 9.9 mg/dL (ref 8.6–10.2)
Chloride: 102 mmol/L (ref 96–106)
Creatinine, Ser: 1.6 mg/dL — ABNORMAL HIGH (ref 0.76–1.27)
Globulin, Total: 2.4 g/dL (ref 1.5–4.5)
Glucose: 194 mg/dL — ABNORMAL HIGH (ref 70–99)
Potassium: 4.2 mmol/L (ref 3.5–5.2)
Sodium: 139 mmol/L (ref 134–144)
Total Protein: 6.8 g/dL (ref 6.0–8.5)
eGFR: 46 mL/min/{1.73_m2} — ABNORMAL LOW (ref >59)

## 2023-11-07 LAB — LIPID PANEL
Chol/HDL Ratio: 3.1 {ratio} (ref 0.0–5.0)
Cholesterol, Total: 132 mg/dL (ref 100–199)
HDL: 42 mg/dL (ref >39)
LDL Chol Calc (NIH): 64 mg/dL (ref 0–99)
Triglycerides: 148 mg/dL (ref 0–149)
VLDL Cholesterol Cal: 26 mg/dL (ref 5–40)

## 2023-11-07 LAB — HEMOGLOBIN A1C
Est. average glucose Bld gHb Est-mCnc: 212 mg/dL
Hgb A1c MFr Bld: 9 % — ABNORMAL HIGH (ref 4.8–5.6)

## 2023-11-09 ENCOUNTER — Encounter: Payer: Self-pay | Admitting: Family Medicine

## 2023-11-12 ENCOUNTER — Encounter: Payer: Self-pay | Admitting: Family Medicine

## 2023-11-12 ENCOUNTER — Other Ambulatory Visit: Payer: Self-pay

## 2023-11-12 NOTE — Progress Notes (Signed)
 11/12/2023 Name: Danny Montoya MRN: [id number] DOB: [birth year]  Chief Complaint  Patient presents with   Diabetes Management Plan   Danny Montoya is a 73 y.o. year old male who presented for a telephone visit.   They were referred to the pharmacist by their PCP for assistance in managing diabetes.   Subjective:  Care Team: Primary Care Provider: Bevin Bernice RAMAN, DO ; Next Scheduled Visit: 03/07/24  Medication Access/Adherence Current Pharmacy:  [facility] - [place], [place] - [address] [facility] [address] [place] [address] Phone: [phone] Fax: [phone]  [facility] [address], [place] - [address] [place] [address] Phone: [phone] Fax: [phone]  [facility] - [place], [place] - [address] [place] [address] Phone: [phone] Fax: [phone]  -Patient reports affordability concerns with their medications: No  -Patient reports access/transportation concerns to their pharmacy: No  -Patient reports adherence concerns with their medications:  Yes    Diabetes: Current medications: NovoLog 40 units 3 times daily with meals, metformin  1000 mg daily with breakfast, Ozempic  1 mg weekly -Patient is also prescribed Tresiba 30 units daily, but patient endorses he has not been using this medication regularly; because he does not see much difference in blood glucose readings when he does. -Patient's A1c has recently increased to 9.0% from 7.4% -Patient states he did not expect A1c of 9.0 based on freestyle libre data reflecting average blood glucose of 160 -Patient is using freestyle libre 2 for CGM and joined [employer] to share data during our visit today:  Date of Download: 11/12/23 % Time CGM is active: 53% Average Glucose: 162 mg/dL Glucose Management Indicator: 7.2%  Glucose Variability:  31.5 (goal <36%) Time in Goal:  - Time in range 70-180: 62% - Time above range: 38% - Time below range: 0%  Hypertension Current medications: Amlodipine  10 mg daily, chlorthalidone  25 mg daily, furosemide  20 mg daily, lisinopril  40 mg daily, spironolactone  25 mg daily -Last office visit blood pressure was hypotensive at 92/60 -Patient states he took blood pressure medications closer and cadence to 1 another than he normally does prior to this visit -Patient does have home blood pressure monitor but does not measure on a regular basis  Depression/Anxiety : Current medications: Escitalopram  10 mg daily initiated approximately 4 months ago -Patient states he has not noticed any improvement or decline in symptoms since starting escitalopram  10 mg, and he is inquiring about increasing to 20 mg daily  Objective: Lab Results  Component Value Date   HGBA1C 9.0 (H) 11/06/2023   Lab Results  Component Value Date   CREATININE 1.60 (H) 11/06/2023   BUN 22 11/06/2023   NA 139 11/06/2023   K 4.2 11/06/2023   CL 102 11/06/2023   CO2 21 11/06/2023   Medications Reviewed Today     Reviewed by Deanna Channing LABOR, RPH (Pharmacist) on 11/12/23 at 1148  Med List Status: <None>   Medication Order Taking? Sig Documenting Provider Last Dose Status Informant  amLODipine  (NORVASC ) 10 MG tablet [id number] Yes TAKE 1 TABLET BY MOUTH DAILY Pietro Redell RAMAN, MD Taking Active   aspirin  EC 81 MG tablet [id number] Yes Take 1 tablet (81 mg total) by mouth daily. Swallow whole. Pietro Redell RAMAN, MD Taking Active   atorvastatin (LIPITOR) 80 MG tablet [id number] Yes Take 80 mg by mouth daily. [provider] Taking Active   chlorthalidone  (HYGROTON ) 25 MG tablet [id number] Yes Take 2 tablets (50 mg total) by mouth daily. Bevin Bernice RAMAN, DO Taking Active   Continuous Blood Gluc Receiver (FREESTYLE LIBRE 14 DAY READER) DEVI [id number] Yes by Does not apply route. [provider] Taking Active             Med Note ZENA, Vaniya Augspurger A   Fri Jul 24, 2023 11:19 AM) [place] 2  Continuous Glucose Sensor (FREESTYLE [place] 2 SENSOR) [place] [id number] Yes PLACE ON SKIN AND CHANGE EVERY  14 DAYS Bevin Bernice RAMAN, DO Taking Active   escitalopram  (LEXAPRO ) 10 MG tablet [id number] Yes TAKE 1 TABLET BY MOUTH AT  BEDTIME Bevin, Erika S, DO Taking Active   ezetimibe  (ZETIA ) 10 MG tablet [id number] Yes Take 1 tablet (10 mg total) by mouth daily. Pietro Redell RAMAN, MD Taking Active            Med Note ESTER SHIVERS   Tue Jul 28, 2023  9:06 AM)    fenofibrate  160 MG tablet [id number]   [provider]  Active   furosemide  (LASIX ) 20 MG tablet [id number] Yes Take 1 tablet (20 mg total) by mouth daily. Bevin Bernice RAMAN, DO Taking Active   gabapentin  (NEURONTIN ) 300 MG capsule [id number] Yes TAKE 1 CAPSULE BY MOUTH 3 TIMES  DAILY Bevin, Erika S, DO Taking Active   glucose blood test strip [id number]  1 each by Other route as needed for other. Use as instructed [provider]  Active   HYDROcodone -acetaminophen  (NORCO) 10-325 MG tablet [id number]  Take 1 tablet by mouth every 8 (eight) hours as needed. Curtis Debby PARAS, MD  Active   insulin aspart (NOVOLOG FLEXPEN) 100 UNIT/ML FlexPen [id number] Yes Inject 32 Units into the skin 3 (three) times daily with meals. [provider] Taking Active            Med Note ZENA, Loida Calamia A   Fri Jul 24, 2023 11:15 AM) PAP  insulin degludec (TRESIBA FLEXTOUCH) 100 UNIT/ML FlexTouch Pen [id number] No Inject 24 Units into the skin daily.  Patient not taking: Reported on 11/12/2023   [provider] Not Taking Active            Med Note ZENA, Finas Delone A   Fri Jul 24, 2023 11:16 AM) PAP  INSULIN SYRINGE .5CC/29G (B-D INS SYR ULTRAFINE .5CC/29G) 29G X 1/2 0.5 ML MISC [id number]  by Does not apply route. [provider]  Active   lisinopril  (ZESTRIL ) 40 MG tablet [id number] Yes Take 1 tablet (40 mg total) by mouth daily. Bevin Bernice RAMAN, DO Taking Active    metFORMIN  (GLUCOPHAGE ) 1000 MG tablet [id number] Yes Take 1,000 mg by mouth daily with breakfast. [provider] Taking Active   prazosin  (MINIPRESS ) 1 MG capsule [id number]  TAKE 1 CAPSULE BY MOUTH TWICE  DAILY Bevin, Erika S, DO  Active   Semaglutide , 2 MG/DOSE, 8 MG/3ML SOPN [id number] Yes Inject 2 mg into the skin once a week. [provider] Taking Active            Med Note ZENA, Hobart Marte A   Thu Oct 15, 2023 10:40 AM) Using 1mg  on hand  spironolactone  (ALDACTONE ) 25 MG tablet [id number] Yes TAKE 1 TABLET BY MOUTH DAILY Wachs, Erika S, DO Taking Active            Med Note ZENA, Gabbie Marzo A   Thu Oct 15, 2023 10:39 AM)    tamsulosin  (FLOMAX ) 0.4 MG  CAPS capsule [id number] Yes TAKE 1 CAPSULE BY MOUTH DAILY Bevin Asal S, DO Taking Active            Assessment/Plan:   Diabetes: -Currently uncontrolled -Encouraged patient to resume Tresiba at 30 units daily until Ozempic  2 mg is received from [facility] PAP -Once Ozempic  2 mg arrive at [facility], decrease Tresiba to 24 units daily and NovoLog to 32 units 3 times daily with meals -Continue metformin  1000 mg daily -Based on libre 2 data, CGM is only active 53% of the time; so this is not a good reflection of day to day blood glucose control.  I recommend patient scan sensor at least 4 times daily (fasting and 2 hours after each meal).  Hypertension -Continue current regimen at this time and be sure to space medications out appropriately to avoid hypotension -Monitor and record home blood pressure at least 3 times a week  Depression/Anxiety: -Currently uncontrolled -Consulting Dr. Bevin regarding increasing escitalopram  to 20 Mg daily  Follow Up Plan: 4 weeks  [name], PharmD, DPLA

## 2023-11-26 ENCOUNTER — Other Ambulatory Visit: Payer: Self-pay | Admitting: Family Medicine

## 2023-11-26 NOTE — Telephone Encounter (Signed)
Meds ordered this encounter  Medications   gabapentin (NEURONTIN) 300 MG capsule    Sig: TAKE 1 CAPSULE BY MOUTH 3 TIMES  DAILY    Dispense:  300 capsule    Refill:  2    Please send a replace/new response with 100-Day Supply if appropriate to maximize member benefit. Requesting 1 year supply.

## 2023-12-08 ENCOUNTER — Other Ambulatory Visit: Payer: Self-pay

## 2023-12-08 NOTE — Progress Notes (Signed)
   12/08/2023  Patient ID: Danny Montoya, male   DOB: [birth year], 73 y.o.   MRN: [id number]  Subjective/Objective: Patient outreach to follow-up on management of chronic disease states, including diabetes, hypertension, and anxiety/depression  Diabetes Management -Current medications:  Tresiba 30 units daily, Novolog 40 units TID with meals, metformin 1000mg  BID, Ozempic 1mg  -Patient currently enrolled in [facility] PAP for Ozempic 2mg , Novolog, and [place]; but medications have not yet arrived at [facility].  Patient will use last dose of Ozempic 1mg  on hand this week, but he has plenty of [place] and Novolog at this time -Patient using Libre 2 for CGM- data from the last 2 weeks: Time active- 69% Avg glucose- 1557 GMI- 7.1% Time in range- 70% High- 27% Very high- 3% Low- 0%  Blood Pressure Control -Current medications:  amlodipine 10mg  daily, chlorthalidone 25mg  daily, furosemide 20mg  daily, lisinopril 40mg  daily, spironolactone 25mg  daily, prazosin 1mg  BID -Patient does not monitor home BP regularly, but last OV reading low at 92/60- patient believes he took BP medication doses too close together -Does not endorse any s/sx of hypotension  Anxiety/Depression -Current medications:  escitalopram 10mg  daily -Patient has been taking this medication consistently since 07/2023 and does not endorse changes in his symptoms -Previously inquired about increasing dose to 20mg , and [name] reached out to patient offering new script for 20mg  daily; but the patient did not see this message.  He would like to increase escitalopram to 20mg  daily  Assessment/Plan:  Diabetes Management Plan -Contacting Nov[employer] to see if patient can get 30 day voucher to cover 1 month of Ozempic 2mg  at [facility] pharmacy ([facility]) -Recommend increasing to Ozempic 2mg , decreasing Tresiba to 24 units daily and Novolog to 32 units TID -Continue metformin 1000mg  BID  Blood Pressure Control -Continue current  regimen and regular follow-up with PCP and PharmD  Anxiety/Depression -Coordinating with provider covering for [name] to see if they are comfortable increasing escitalopram to 20mg  daily if I continue to check in with patient monthly at least until his scheduled follow-up with [name] in June -Discussed it may take several weeks to notice difference from dose increase -Counseled to let myself or prescriber know immediately if symptoms worsen  Follow-up:  4 weeks  [name], PharmD, DPLA

## 2023-12-10 ENCOUNTER — Telehealth: Payer: Self-pay

## 2023-12-10 MED ORDER — ESCITALOPRAM OXALATE 20 MG PO TABS: 20.0000 mg | ORAL_TABLET | Freq: Every day | ORAL | 0 refills | Status: DC

## 2023-12-10 NOTE — Progress Notes (Signed)
   12/10/2023  Patient ID: Danny Montoya, male   DOB: 1951-06-23, 73 y.o.   MRN: [id number]  Contacted [employer]o patient assistance program to obtain a free 30-day voucher, so patient can fill a 1 month supply at his preferred pharmacy at no cost due to delivery delays of medications.  Prescription pending for provider to sign to go to [facility].  Sending patient a MyChart message to make him aware this is being sent and remind him to decrease [place] and NovoLog when starting this Ozempic dose increase.  Follow-up scheduled with patient for April 1.  30 Day Voucher: [name] [id number] PCN [id number] Grp [id number] ID [id number]  [name], PharmD, DPLA

## 2023-12-11 MED ORDER — SEMAGLUTIDE (2 MG/DOSE) 8 MG/3ML ~~LOC~~ SOPN: 2.0000 mg | PEN_INJECTOR | SUBCUTANEOUS | 0 refills | Status: DC

## 2023-12-15 ENCOUNTER — Telehealth: Payer: Self-pay

## 2023-12-15 NOTE — Progress Notes (Signed)
   12/15/2023  Patient ID: Danny Montoya, male   DOB: Dec 28, 1950, 73 y.o.   MRN: [id number]  Received in basket message from patient stating pharmacy was not able to [employer] voucher provided on Ozempic 2 mg prescription.  Contacted his local [employer], and they actually had not added the processing information for this voucher.  Prescription is now going through at no cost for the patient.  Sending MyChart message to make him aware.  [name], PharmD, DPLA

## 2023-12-16 ENCOUNTER — Telehealth: Payer: Self-pay

## 2023-12-16 NOTE — Telephone Encounter (Signed)
 Forwarding to [place] as an Financial planner.  Tiffany  [employer]ovo [employer] PAP shipment for [employer] flexpen 100 u dose / 8 boxes, [place] FlexTouch 100 u / 2 boxes, Ozempic 2 mg / 4 boxes, and NovoFine 32g tip needles / 4 boxes received this morning. Please contact the patient to come and pick up their order today. Placed in the PAP fridge with patient identifier. Thanks in advance.   Novolog NDC: [id number] LOT: [id number] EXP: 2026-02-02  [name] NDC: 4540-9811-91 LOT: [id number] EXP: 2026-03-05  Ozempic NDC: 2130-8657-84 LOT: [id number] EXP: 2026-06-05  NovoFine needles LIST: [id number]  LOT: [id number] EXP: 2028-04-04

## 2023-12-22 ENCOUNTER — Other Ambulatory Visit: Payer: Self-pay | Admitting: Medical-Surgical

## 2023-12-24 NOTE — Telephone Encounter (Signed)
Pt notified. Roselyn Reef, CMA

## 2024-01-05 ENCOUNTER — Other Ambulatory Visit: Payer: Self-pay

## 2024-01-06 NOTE — Progress Notes (Signed)
   01/06/2024  Patient ID: Danny Montoya, male   DOB: [birth year], 73 y.o.   MRN: [id number]  Subjective/Objective Telephone visit to follow-up on management of diabetes  Diabetes Management Plan -Current medications:  Novolog 20-40 units TID with meals based on BG, Tresiba 24 units daily, Ozempic 2mg  weekly, metformin 1000mg  daily -Receives Tresiba, [place], pen needles, and Ozempic from [place] PAP; and 4 month supply of each rec'd middle of March -Patient not using [place] daily, because forgets doses and does not notice difference when he does use -Using [place] 2 for CGM-data from last 2 weeks: CGM active 46% Average Glucose 165 GMI not available Target Range 63%, High 28%, Very High 7%, Low 2% -A1c 9.0% 1/31-up from 7.4% 07/2023  Assessment/Plan  Diabetes Management Plan -Resume Tresiba at 24 units daily -Educated patient effects of long-acting insulin will not be as noticeable as rapid acting insulin; but benefit is seen over longer period of time (2 week Josephine Igo reports, A1c, etc) -Suggested setting out 4 pen needles each morning to help remind to take all daily insulin injections -Patient sees PCP again 6/2 and will be due for A1c  Follow-up:  4 weeks  [name], PharmD, DPLA

## 2024-01-13 IMAGING — CT CT ANGIO CHEST
3 of 10 series · 18 of 46 positions shown · IV contrast (APPLIED)
Comparison: None available

CLINICAL DATA: History of thoracic aortic aneurysm.

EXAM:
CT ANGIOGRAPHY CHEST WITH CONTRAST
TECHNIQUE: Multidetector CT imaging of the chest was performed using the
standard protocol during bolus administration of intravenous
contrast. Multiplanar CT image reconstructions and MIPs were
obtained to evaluate the vascular anatomy.

[Series 6: arterial · axial · arterial · 0.73mm/px · z∈[-307,-35]mm · 13 of 160 slices shown]
[im 12/160  lung]
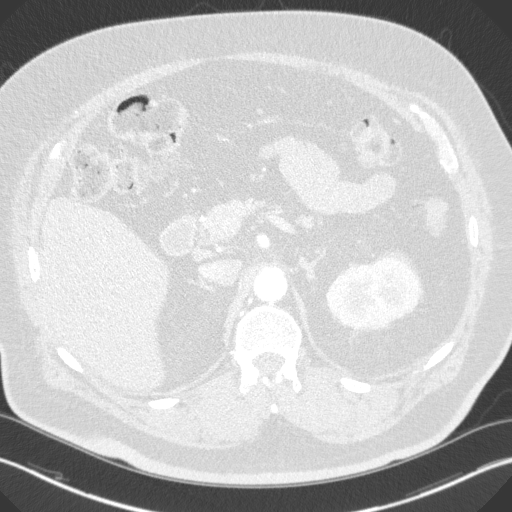
[im 23/160  soft-tissue]
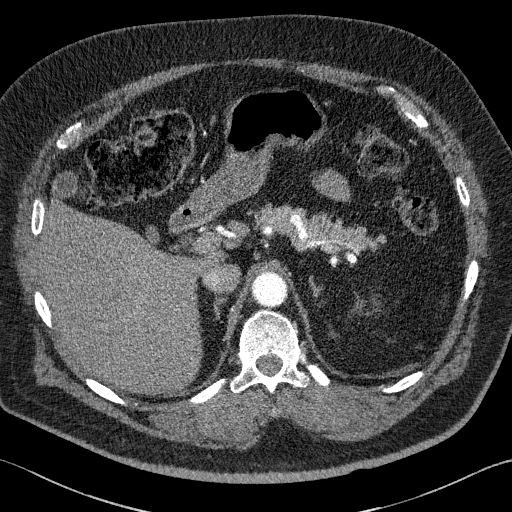
[im 35/160  lung]
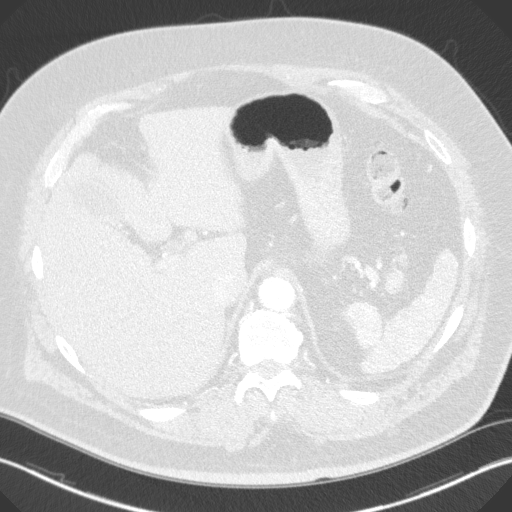
[im 46/160  soft-tissue]
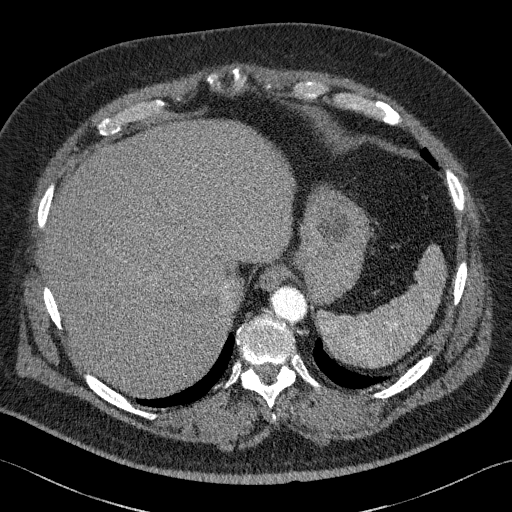
[im 57/160  lung]
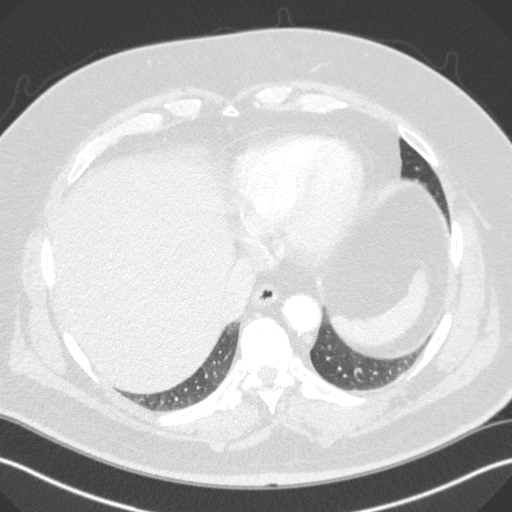
[im 69/160  soft-tissue]
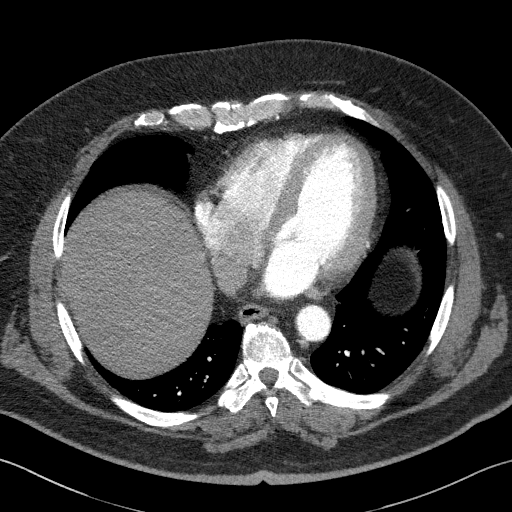
[im 80/160  lung]
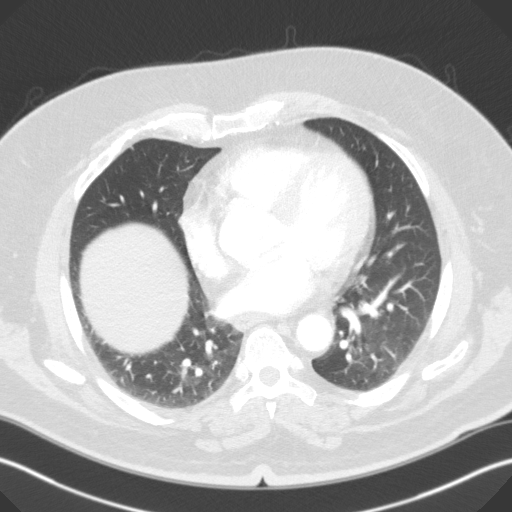
[im 91/160  soft-tissue]
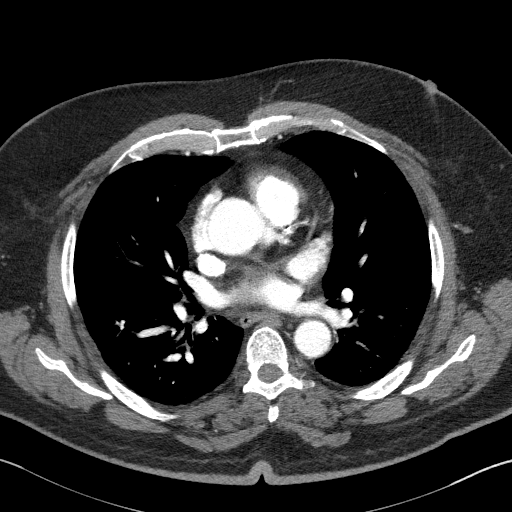
[im 103/160  lung]
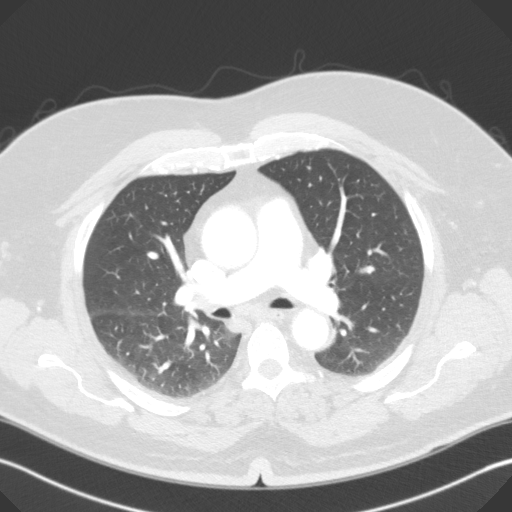
[im 114/160  soft-tissue]
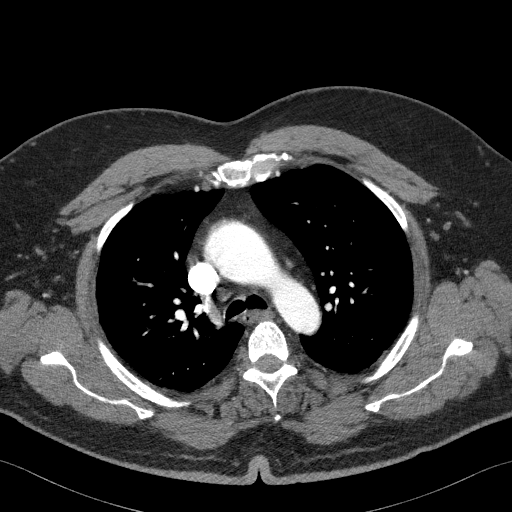
[im 125/160  lung]
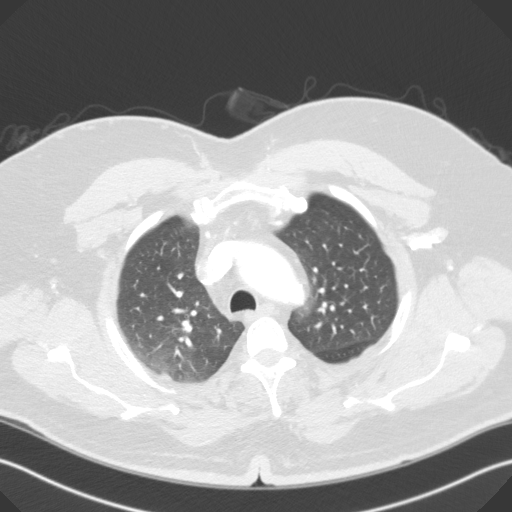
[im 137/160  soft-tissue]
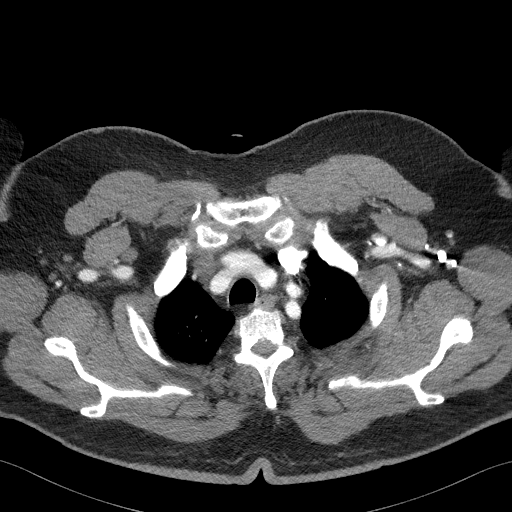
[im 148/160  lung]
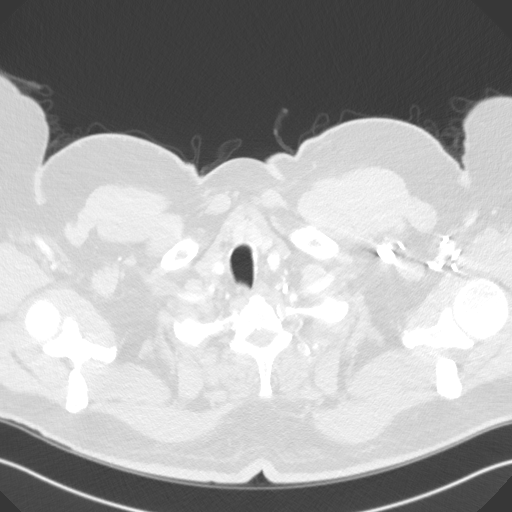

[Series 7: lung · axial · 0.73mm/px · z∈[-245,-203]mm · 2 of 128 slices shown]
[im 11/128  soft-tissue]
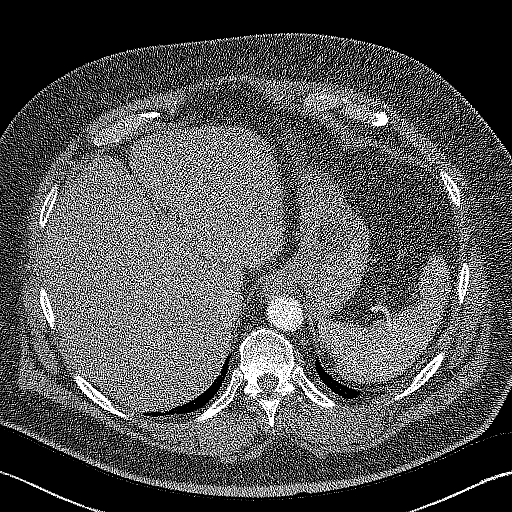
[im 32/128  soft-tissue]
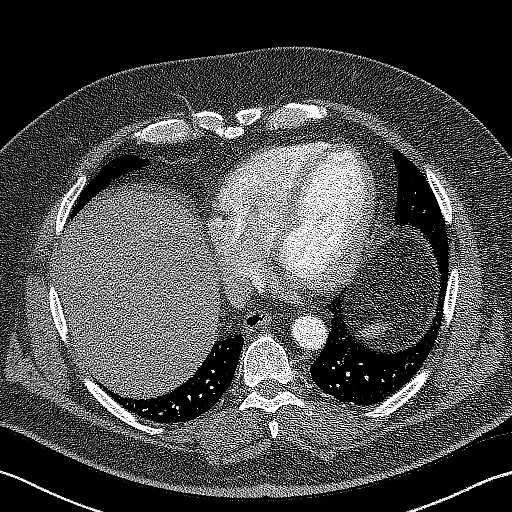

[Series 8: coronals · coronal · 0.66mm/px · 3 of 142 slices shown]
[im 36/142  soft-tissue]
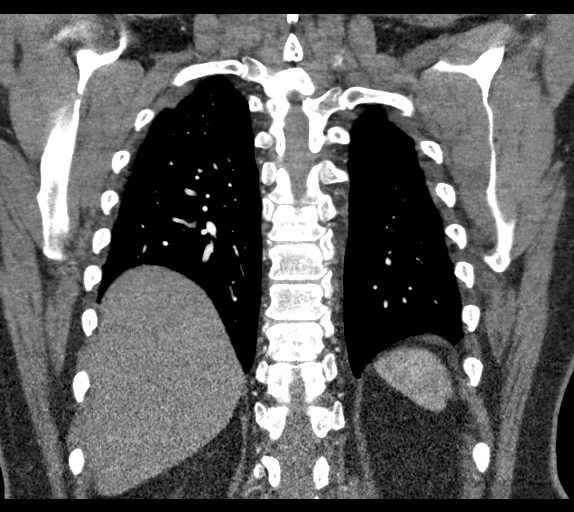
[im 71/142  soft-tissue]
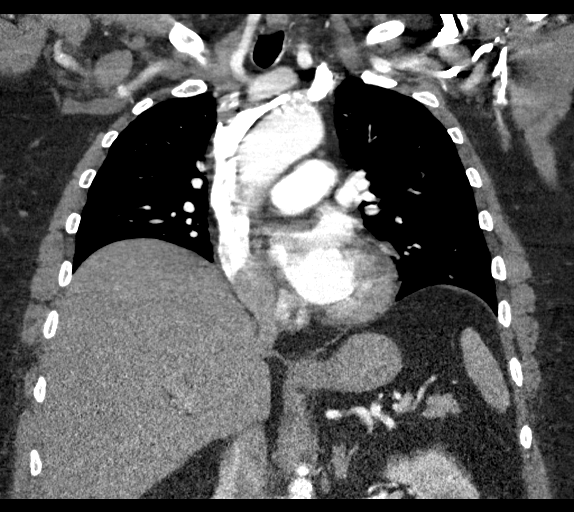
[im 106/142  soft-tissue]
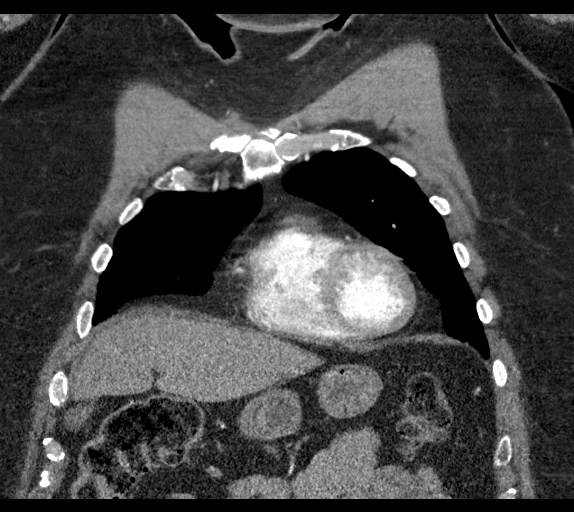

[18 of 46 positions shown; findings below may reference images not displayed]

RADIATION DOSE REDUCTION: This exam was performed according to the
departmental dose-optimization program which includes automated
exposure control, adjustment of the mA and/or kV according to
patient size and/or use of iterative reconstruction technique.

CONTRAST:  80mL OMNIPAQUE IOHEXOL 350 MG/ML SOLN
FINDINGS: Cardiovascular: Calcified and noncalcified aortic atherosclerosis
without acute process.

4.1 cm ascending thoracic aortic caliber as measured in the
transverse plane greatest coronal measurement of the ascending
thoracic aorta approximately 4 cm. Aortic root measuring
approximately 3.6 cm with the descending thoracic aorta at 2.6 cm.

Central pulmonary vasculature is unremarkable.

Heart size moderately enlarged.

Mediastinum/Nodes: No adenopathy in the chest. Esophagus grossly
normal.

Lungs/Pleura: No pneumothorax. No pleural effusion. No
consolidation. Minimal atelectasis. Small LEFT lower lobe
juxtapleural pulmonary nodule measuring 6 mm (image 67/7)

Upper Abdomen: Incidental imaging of upper abdominal contents with
signs of hepatic steatosis. No acute upper abdominal process.

Musculoskeletal: Spinal degenerative changes. No acute or
destructive bone process.

Review of the MIP images confirms the above findings.
IMPRESSION: 1. 4.1 cm ascending thoracic aortic caliber as measured in the
transverse plane greatest coronal measurement of the ascending
thoracic aorta approximately 4 cm. Recommend annual imaging followup
by CTA or MRA. This recommendation follows 2090
ACCF/AHA/AATS/ACR/ASA/SCA/PADAM/MAK/RAFKO/YOVANNA Guidelines for the
Diagnosis and Management of Patients with Thoracic Aortic Disease.
Circulation. 2090; 121: E266-e369. Aortic aneurysm NOS (5TQTM-0TW.6)
2. 6 mm LEFT lower lobe juxtapleural pulmonary nodule. Non-contrast
chest CT at 6-12 months is recommended. If the nodule is stable at
time of repeat CT, then future CT at 18-24 months (from today's
scan) is considered optional for low-risk patients, but is
recommended for high-risk patients. This recommendation follows the
consensus statement: Guidelines for Management of Incidental
Pulmonary Nodules Detected on CT Images: From the [HOSPITAL]
3. Hepatic steatosis.
4. Aortic atherosclerosis.

Aortic Atherosclerosis (5TQTM-N90.0).

## 2024-01-20 NOTE — Progress Notes (Signed)
 [facility] Quality Team Note  Name: Danny Montoya Date of Birth: August 30, 1951 MRN: [id number] Date: 01/20/2024  [facility] Quality Team has reviewed this patient's chart, please see recommendations below:  [facility] Quality Other; (PATIENT NEEDS URINE MICROALBUMIN/CREATININE RATIO TEST COMPLETED FOR GAP CLOSURE. PATIENT ALSO DUE FOR DIABETIC EYE EXAM. LAST EXAM IN 2024 WAS POSITIVE AND IS NOT COMPLIANT FOR 2025 MEASUREMENT YEAR. PATIENT ALSO DUE FOR COLON CANCER SCREENING. PLEASE ADVISE DURING 03/07/2024 APPT WITH PCP)

## 2024-02-02 ENCOUNTER — Other Ambulatory Visit: Payer: Self-pay

## 2024-02-02 NOTE — Progress Notes (Unsigned)
   02/02/2024  Patient ID: Danny Montoya, male   DOB: 1951/07/16, 73 y.o.   MRN: [id number]  Outreach attempt for scheduled telephone visit unsuccessful, but I was able to leave HIPAA compliant voicemail with my direct phone number.  I am also sending a MyChart message to attempt to reschedule and will call patient if I do not hear back in 1-2 weeks.  Linn Rich, PharmD, DPLA

## 2024-02-04 ENCOUNTER — Other Ambulatory Visit: Payer: Self-pay | Admitting: Family Medicine

## 2024-02-04 MED ORDER — FENOFIBRATE 160 MG PO TABS: 160.0000 mg | ORAL_TABLET | Freq: Every day | ORAL | 0 refills | Status: DC

## 2024-02-04 MED ORDER — METFORMIN HCL 1000 MG PO TABS: 1000.0000 mg | ORAL_TABLET | Freq: Every day | ORAL | 0 refills | Status: DC

## 2024-02-04 NOTE — Telephone Encounter (Signed)
 Forwarding message to Danny Montoya for review.  Requesting rx rf of  Metformin  1,000mg  tab Last written 12/23/2019 by historical provider And Fenofibrate  160mg  Last written 01/27/2020 by historical provider Last OV 11/06/2023 Upcoming appt 03/07/2024

## 2024-02-10 ENCOUNTER — Other Ambulatory Visit: Payer: Self-pay | Admitting: Medical-Surgical

## 2024-02-11 ENCOUNTER — Encounter: Payer: Self-pay | Admitting: Family Medicine

## 2024-02-18 ENCOUNTER — Other Ambulatory Visit: Payer: Self-pay | Admitting: Medical-Surgical

## 2024-02-23 ENCOUNTER — Other Ambulatory Visit: Payer: Self-pay

## 2024-02-23 NOTE — Progress Notes (Signed)
   02/23/2024  Patient ID: Evelynn Hirschfeld, male   DOB: August 07, 1951, 73 y.o.   MRN: [id number]   Subjective/Objective Telephone visit to follow-up on management of diabetes   Diabetes Management Plan -Current medications:  Novolog 20-40 units TID with meals based on BG, Tresiba 30 units daily, Ozempic  2mg  weekly, metformin  1000mg  daily -Receives Tresiba, Novolog, pen needles, and Ozempic  from Novo PAP; and 4 month supply of each rec'd middle of March -Patient states he has been using [place] long-acting insulin approximately 90% of the time since we last spoke (had previously not been using) -Using [place] [employer] for CGM-data from last 2 weeks: CGM active 58 % Average Glucose 1183 GMI 7.7% Target Range 49%, High 36%, Very High 13%, Low 1%, Very Low 1% -A1c 9.0% 1/31-up from 7.4% 07/2023 -Patient endorses waking around 12-1pm when he will then taking morning medications including Novolog and Tresiba.  Then will eat breakfast of 2 fried or scrambled eggs and sausage.  Patient states BG begins to increase upon waking and continues to do so after having just coffee.  States does not wake up hungry but eats out of habit. -Patient expressing concern that Ozempic  2mg  is not as effective as it once was in regard to BG control and weight loss   Assessment/Plan   Diabetes Management Plan -Contacting insurance to see what GLP1 alternatives are covered and what patient's copay would be, but made patient aware no alternatives (other than Rybelsus ) have patient assistance programs -If we are unable to change GLP1, we will likely need to consider addition of SGLT2 (would likely qualify for Farxiga PAP) or increase insulin dosing -Educated patient that he can try changing long acting insulin before bed to see if this helps with hyperglycemia upon waking -Recommend smaller, more frequent meals balanced with protein and carbohydrate   Follow-up:  Will send message about GLP1 alternatives and schedule telephone f/u at that  time   Linn Rich, PharmD, DPLA

## 2024-02-24 ENCOUNTER — Telehealth: Payer: Self-pay

## 2024-02-24 ENCOUNTER — Other Ambulatory Visit: Payer: Self-pay | Admitting: Medical-Surgical

## 2024-02-24 NOTE — Progress Notes (Signed)
   02/24/2024  Patient ID: Evelynn Hirschfeld, male   DOB: 06-04-1951, 73 y.o.   MRN: [id number]  Contacted patient's insurance plan to see what GLP1 medications, outside of Ozempic , are covered by his plan.  Mounjaro  and Trulicity are both covered and would be $47/month once deductible of $250 is met (which would occur after paying $302 for first fill of medication).  Contacting patient via MyChart to provide this information to see if this would be an option for him.  If so, I recommend changing Ozempic  2mg  to Mounjaro  7.5mg  weekly, increasing every 4 weeks as able/needed.  Prior authorization would be required for Mounjaro  or Trulicity.  Linn Rich, PharmD, DPLA

## 2024-02-28 ENCOUNTER — Other Ambulatory Visit: Payer: Self-pay | Admitting: Cardiology

## 2024-02-28 DIAGNOSIS — I10 Essential (primary) hypertension: Secondary | ICD-10-CM

## 2024-03-07 ENCOUNTER — Ambulatory Visit: Payer: Medicare Other | Admitting: Family Medicine

## 2024-03-14 ENCOUNTER — Other Ambulatory Visit: Payer: Self-pay | Admitting: Medical-Surgical

## 2024-03-18 IMAGING — CR DG SHOULDER 2+V*R*
3 series · 3 of 3 positions shown · non-contrast
Comparison: None Available.

CLINICAL DATA: Right shoulder pain

EXAM:
RIGHT SHOULDER - 2+ VIEW

[w shoulder grashey right]
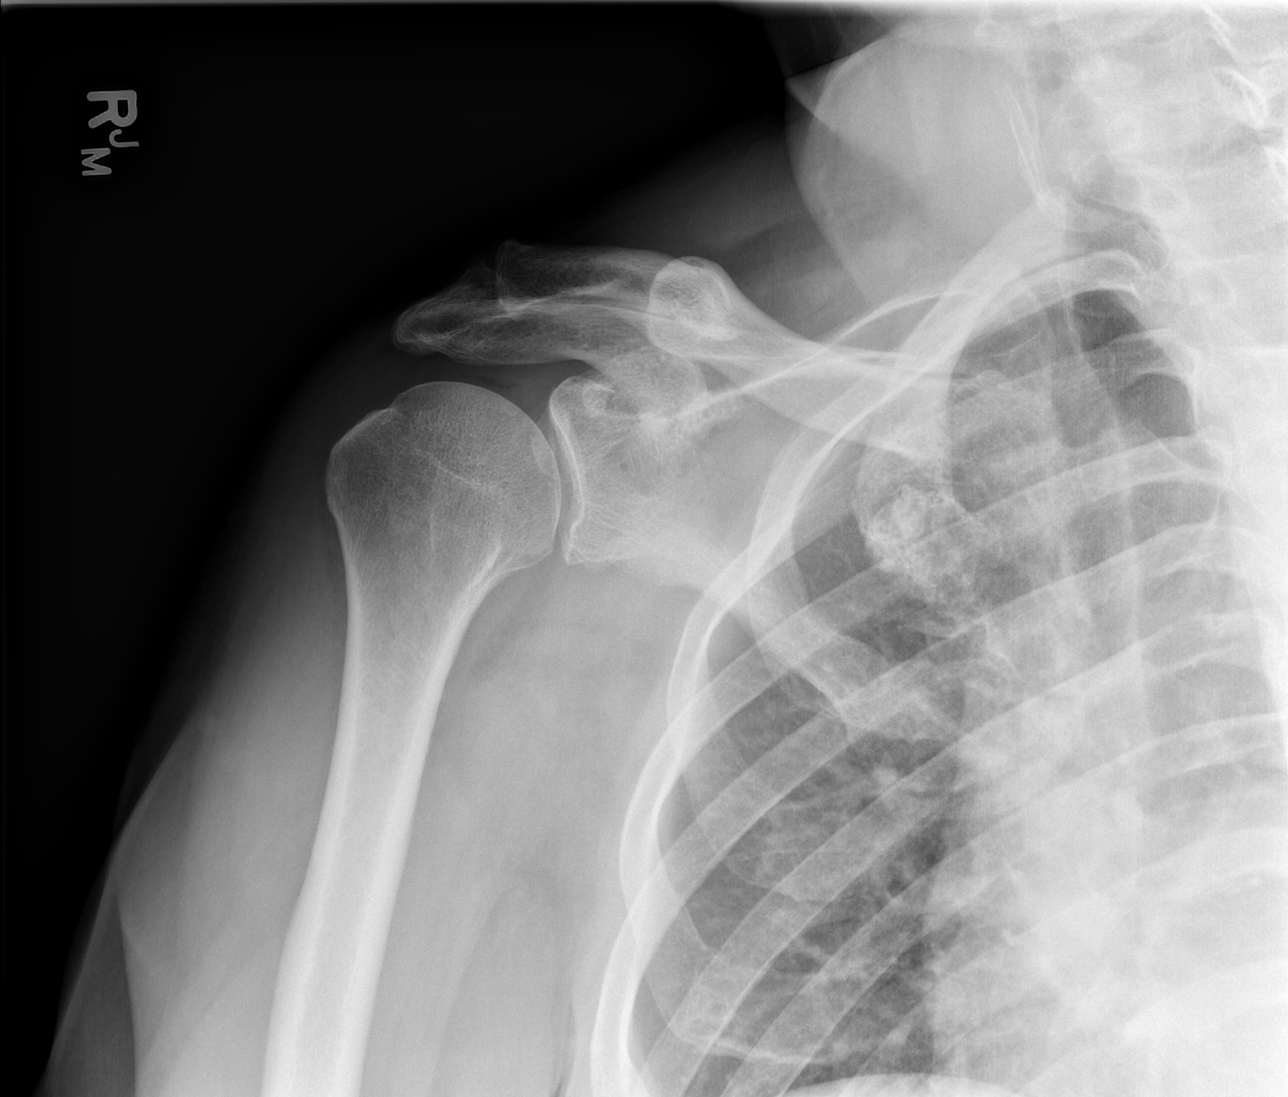

[w shoulder y view right *]
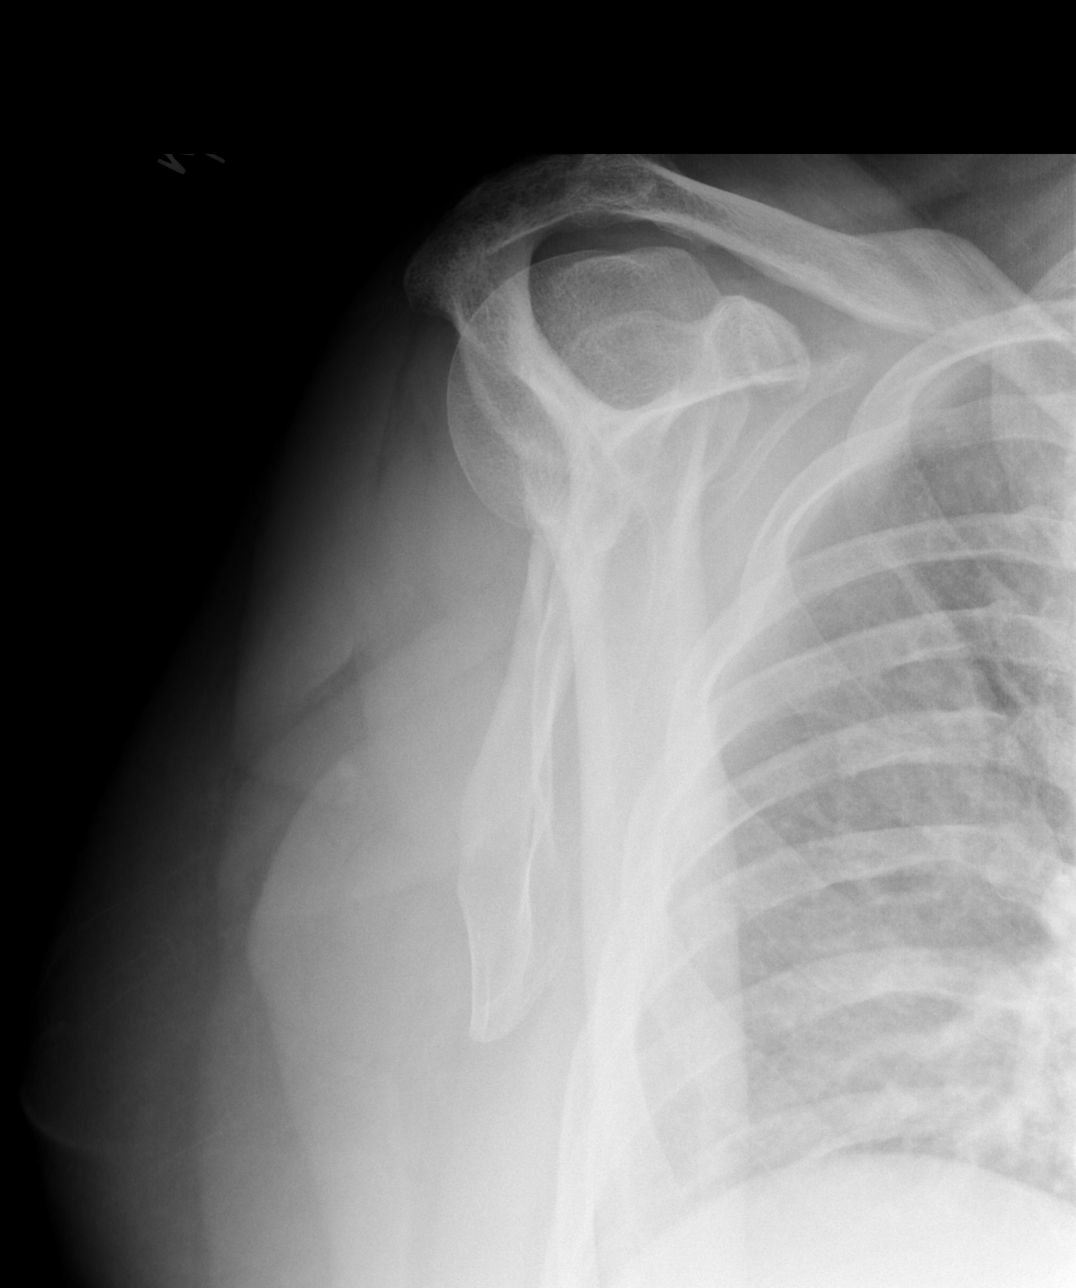

[w shoulder axillary right *]
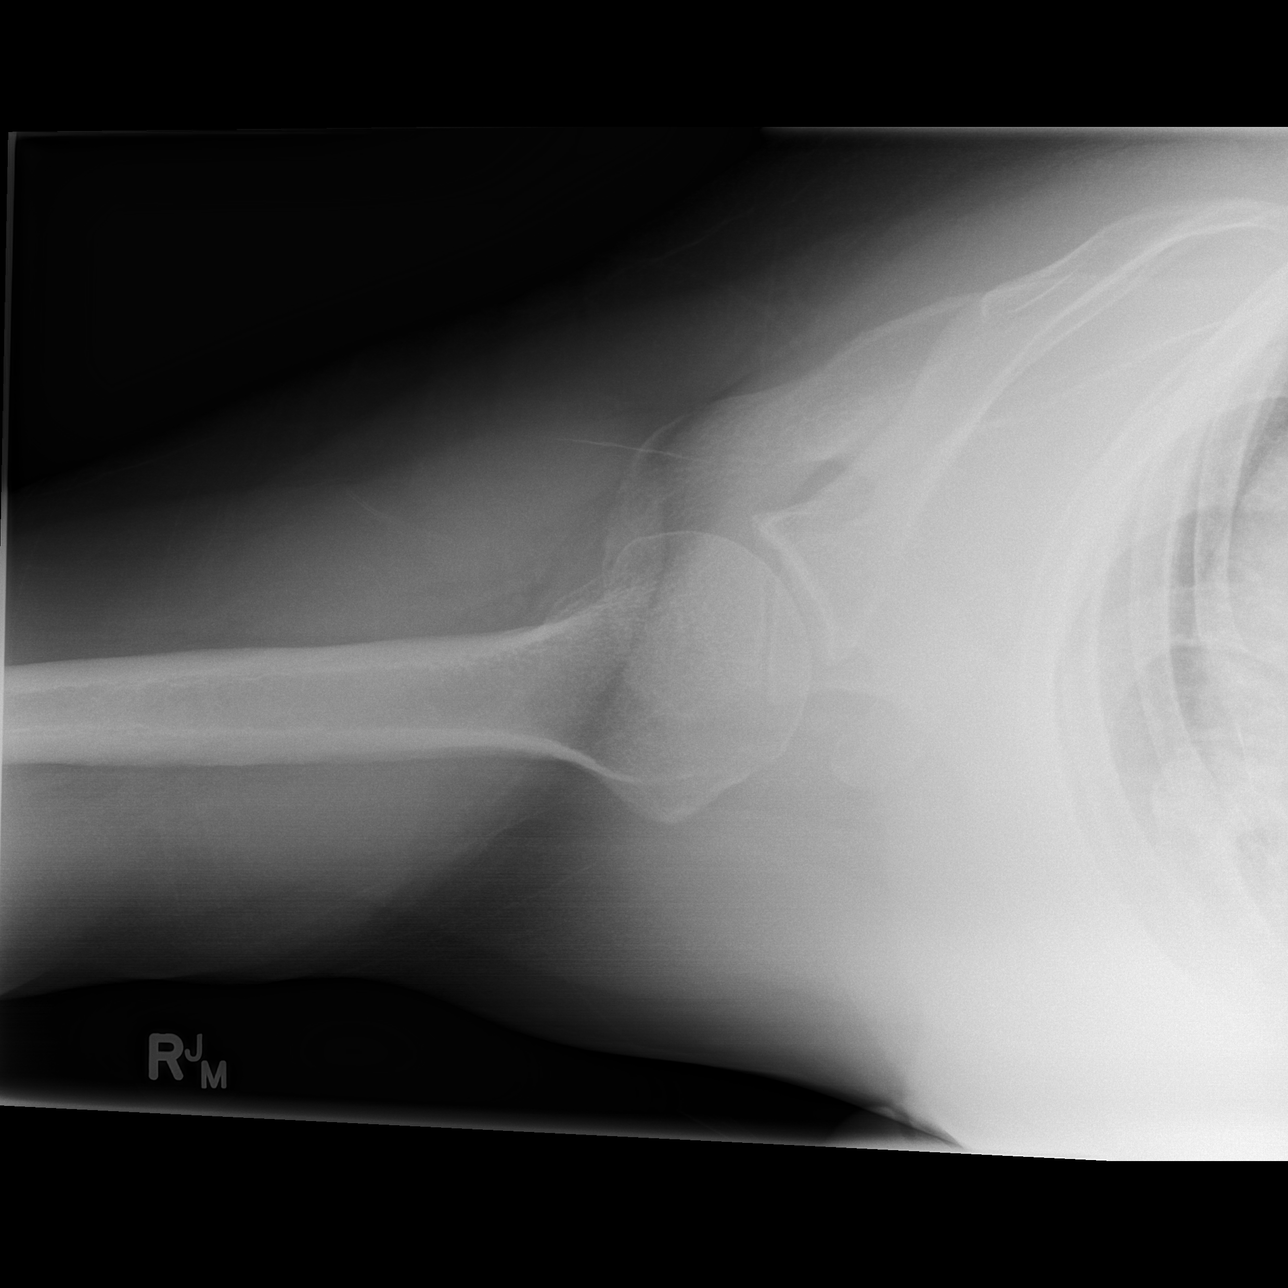

[3 of 3 positions shown; findings below may reference images not displayed]

FINDINGS: No acute fracture or dislocation identified. Mild-to-moderate
narrowing of the glenohumeral joint and acromioclavicular joint with
early inferior marginal osteophytes. No focal bone lesion
identified.
IMPRESSION: Degenerative changes with no acute osseous abnormality identified.

## 2024-03-23 ENCOUNTER — Ambulatory Visit: Admitting: Urgent Care

## 2024-03-23 VITALS — BP 103/70 | HR 94 | Resp 20 | Ht 73.0 in | Wt 307.8 lb

## 2024-03-23 DIAGNOSIS — R351 Nocturia: Secondary | ICD-10-CM

## 2024-03-23 DIAGNOSIS — Z794 Long term (current) use of insulin: Secondary | ICD-10-CM | POA: Diagnosis not present

## 2024-03-23 DIAGNOSIS — E1165 Type 2 diabetes mellitus with hyperglycemia: Secondary | ICD-10-CM | POA: Diagnosis not present

## 2024-03-23 DIAGNOSIS — N1831 Chronic kidney disease, stage 3a: Secondary | ICD-10-CM | POA: Diagnosis not present

## 2024-03-23 DIAGNOSIS — E1142 Type 2 diabetes mellitus with diabetic polyneuropathy: Secondary | ICD-10-CM

## 2024-03-23 DIAGNOSIS — R600 Localized edema: Secondary | ICD-10-CM

## 2024-03-23 DIAGNOSIS — I10 Essential (primary) hypertension: Secondary | ICD-10-CM | POA: Diagnosis not present

## 2024-03-23 DIAGNOSIS — E785 Hyperlipidemia, unspecified: Secondary | ICD-10-CM

## 2024-03-23 DIAGNOSIS — Z1211 Encounter for screening for malignant neoplasm of colon: Secondary | ICD-10-CM | POA: Diagnosis not present

## 2024-03-23 DIAGNOSIS — G4730 Sleep apnea, unspecified: Secondary | ICD-10-CM | POA: Diagnosis not present

## 2024-03-23 DIAGNOSIS — R609 Edema, unspecified: Secondary | ICD-10-CM | POA: Diagnosis not present

## 2024-03-23 DIAGNOSIS — F321 Major depressive disorder, single episode, moderate: Secondary | ICD-10-CM

## 2024-03-23 MED ORDER — PRAZOSIN HCL 1 MG PO CAPS: 1.0000 mg | ORAL_CAPSULE | Freq: Two times a day (BID) | ORAL | 3 refills | Status: AC

## 2024-03-23 MED ORDER — CHLORTHALIDONE 25 MG PO TABS: 50.0000 mg | ORAL_TABLET | Freq: Every day | ORAL | 3 refills | Status: AC

## 2024-03-23 MED ORDER — SPIRONOLACTONE 25 MG PO TABS: 25.0000 mg | ORAL_TABLET | Freq: Every day | ORAL | 2 refills | Status: DC

## 2024-03-23 MED ORDER — ESCITALOPRAM OXALATE 20 MG PO TABS: 20.0000 mg | ORAL_TABLET | Freq: Every day | ORAL | 0 refills | Status: DC

## 2024-03-23 MED ORDER — GABAPENTIN 300 MG PO CAPS: 300.0000 mg | ORAL_CAPSULE | Freq: Three times a day (TID) | ORAL | 2 refills | Status: AC

## 2024-03-23 MED ORDER — FUROSEMIDE 20 MG PO TABS: 20.0000 mg | ORAL_TABLET | Freq: Every day | ORAL | 2 refills | Status: DC

## 2024-03-23 MED ORDER — TAMSULOSIN HCL 0.4 MG PO CAPS: 0.4000 mg | ORAL_CAPSULE | Freq: Every day | ORAL | 2 refills | Status: DC

## 2024-03-23 NOTE — Patient Instructions (Addendum)
 Please continue all medications as ordered.  Labs updated today.  Please complete cologuard as ordered.  Please call podiatry to schedule follow up.  Return in 6 weeks for recheck,

## 2024-03-23 NOTE — Progress Notes (Signed)
 Established Patient Office Visit  Subjective:  Patient ID: Danny Montoya, male    DOB: Mar 14, 1951  Age: 73 y.o. MRN: [id number]  Chief Complaint  Patient presents with   Transitions Of Care    HPI  Discussed the use of AI scribe software for clinical note transcription with the patient, who gave verbal consent to proceed.  History of Present Illness   Izick Gasbarro is a 73 year old male with diabetes and peripheral vascular disease who presents for follow-up of his chronic conditions.  He has chronic knee pain due to arthritis, worsened by a previous ineffective vascular procedure. This pain causes difficulty walking, necessitating the use of a clutch for support. He recently fell in his yard, attributing the fall to wearing inappropriate footwear in wet grass.  He is followed for a thoracic aneurysm and sees a cardiologist annually. He is on tamsulosin  for prostate issues and experiences frequent urination. His diabetes management includes Novolog 32 units with meals, Tresiba 30 units daily, and metformin  1000 mg with breakfast, though he recently ran out of metformin  for over a week. He also takes semaglutide  2 mg weekly. His last A1c was 9.0 six months ago. He uses a glucose monitoring system and reports an average sugar level of 153 mg/dL over the past seven days, with spikes between 3-6 PM and 6-9 PM, which he attributes to poor eating habits and comfort foods.  He has experienced infrequent nocturnal hypoglycemia with levels dropping to the 40s, but does not wake up from these episodes. He reports numbness in his feet and difficulty checking them due to limited mobility. He does not wear socks due to discomfort and has not seen a podiatrist recently.  He takes gabapentin  at night for neuropathy, which helps him sleep. He experiences numbness in his hands and difficulty with grip strength. He attributes his weight maintenance to lack of exercise rather than overeating. He has a history of falls,  which have affected his right shoulder.  He uses a CPAP mask for sleep apnea but still feels tired despite sleeping 12-14 hours. He occasionally uses a sleeping pill to aid sleep. He has a family history of heart issues and reports alcohol use, though he can abstain for months at a time.      Patient Active Problem List   Diagnosis Date Noted   Depression, major, single episode, moderate (HCC) 05/28/2023   Chronic kidney disease, stage 3a (HCC) 02/26/2023   Neck pain on right side 11/20/2022   Lower extremity edema 10/09/2022   Type 2 diabetes mellitus with hyperglycemia, with long-term current use of insulin (HCC) 09/11/2022   Nocturia 09/11/2022   Scrotal swelling 09/11/2022   Primary hypertension 09/11/2022   Class 3 severe obesity due to excess calories without serious comorbidity with body mass index (BMI) of 40.0 to 44.9 in adult 09/11/2022   Sleep apnea 09/11/2022   Diabetic peripheral neuropathy associated with type 2 diabetes mellitus (HCC) 05/12/2022   Primary osteoarthritis, right shoulder 02/26/2022   Sensorineural hearing loss (SNHL) of both ears 07/24/2020   Primary osteoarthritis of both knees 09/11/2016   Past Medical History:  Diagnosis Date   Arthritis    Diabetes mellitus (HCC)    Gastroesophageal reflux disease    Heart murmur 2021   Hyperlipidemia    Hypertension    Obstructive sleep apnea    Sleep apnea 10 years   Past Surgical History:  Procedure Laterality Date   IR ANGIOGRAM EXTREMITY LEFT  06/23/2023   IR  ANGIOGRAM SELECTIVE EACH ADDITIONAL VESSEL  06/23/2023   IR EMBO ARTERIAL NOT HEMORR HEMANG INC GUIDE ROADMAPPING  06/23/2023   IR RADIOLOGIST EVAL & MGMT  05/04/2023   IR RADIOLOGIST EVAL & MGMT  07/22/2023   IR US  GUIDE VASC ACCESS RIGHT  06/23/2023   NECK SURGERY     Social History   Socioeconomic History   Marital status: Single    Spouse name: Not on file   Number of children: 0   Years of education: 12   Highest education level: 12th grade   Occupational History   Occupation: Retired.  Tobacco Use   Smoking status: Never   Smokeless tobacco: Never  Vaping Use   Vaping status: Never Used  Substance and Sexual Activity   Alcohol use: Yes    Alcohol/week: 21.0 standard drinks of alcohol    Types: 21 Shots of liquor per week   Drug use: Not Currently   Sexual activity: Not on file  Other Topics Concern   Not on file  Social History Narrative   Lives alone. He enjoys working on cars but hasn't been able to do it lately.   Social Drivers of Corporate investment banker Strain: Low Risk  (03/23/2024)   Overall Financial Resource Strain (CARDIA)    Difficulty of Paying Living Expenses: Not hard at all  Food Insecurity: No Food Insecurity (03/23/2024)   Hunger Vital Sign    Worried About Running Out of Food in the Last Year: Never true    Ran Out of Food in the Last Year: Never true  Transportation Needs: No Transportation Needs (03/23/2024)   PRAPARE - Administrator, Civil Service (Medical): No    Lack of Transportation (Non-Medical): No  Physical Activity: Inactive (03/23/2024)   Exercise Vital Sign    Days of Exercise per Week: 0 days    Minutes of Exercise per Session: Not on file  Stress: No Stress Concern Present (03/23/2024)   [employer] of Occupational Health - Occupational Stress Questionnaire    Feeling of Stress: Only a little  Social Connections: Unknown (03/23/2024)   Social Connection and Isolation Panel    Frequency of Communication with Friends and Family: Once a week    Frequency of Social Gatherings with Friends and Family: Not on file    Attends Religious Services: Never    Database administrator or Organizations: No    Attends Engineer, structural: Not on file    Marital Status: Divorced  Intimate Partner Violence: Not At Risk (07/28/2023)   Humiliation, Afraid, Rape, and Kick questionnaire    Fear of Current or Ex-Partner: No    Emotionally Abused: No    Physically  Abused: No    Sexually Abused: No      ROS: as noted in HPI  Objective:     BP 103/70 (BP Location: Left Arm, Patient Position: Sitting, Cuff Size: Large)   Pulse 94   Resp 20   Ht 6' 1 (1.854 m)   Wt (!) 307 lb 12 oz (139.6 kg)   SpO2 96%   BMI 40.60 kg/m  BP Readings from Last 3 Encounters:  03/23/24 103/70  11/06/23 92/60  10/03/23 134/82   Wt Readings from Last 3 Encounters:  03/23/24 (!) 307 lb 12 oz (139.6 kg)  11/06/23 (!) 305 lb 3 oz (138.4 kg)  08/06/23 (!) 308 lb 12 oz (140 kg)      Physical Exam Vitals and nursing note reviewed.  Constitutional:  General: He is not in acute distress.    Appearance: Normal appearance. He is not ill-appearing, toxic-appearing or diaphoretic.  HENT:     Head: Normocephalic and atraumatic.     Right Ear: Tympanic membrane, ear canal and external ear normal. There is no impacted cerumen.     Left Ear: Tympanic membrane, ear canal and external ear normal. There is no impacted cerumen.     Nose: Nose normal.     Mouth/Throat:     Mouth: Mucous membranes are moist.     Pharynx: Oropharynx is clear. No oropharyngeal exudate or posterior oropharyngeal erythema.   Eyes:     General: No scleral icterus.       Right eye: No discharge.        Left eye: No discharge.     Extraocular Movements: Extraocular movements intact.     Pupils: Pupils are equal, round, and reactive to light.   Neck:     Thyroid: No thyroid mass, thyromegaly or thyroid tenderness.   Cardiovascular:     Rate and Rhythm: Normal rate and regular rhythm.     Pulses: Normal pulses.     Heart sounds: No murmur heard. Pulmonary:     Effort: Pulmonary effort is normal. No respiratory distress.     Breath sounds: Normal breath sounds. No stridor. No wheezing or rhonchi.  Abdominal:     General: Bowel sounds are normal.   Musculoskeletal:     Cervical back: Normal range of motion and neck supple. No rigidity or tenderness.     Right lower leg: Edema  present.     Left lower leg: Edema present.  Feet:     Right foot:     Protective Sensation: 10 sites tested.  7 sites sensed.     Skin integrity: Callus and dry skin present.     Toenail Condition: Right toenails are abnormally thick and long.     Left foot:     Protective Sensation: 10 sites tested.  8 sites sensed.     Skin integrity: Callus and dry skin present.     Toenail Condition: Left toenails are abnormally thick and long.  Lymphadenopathy:     Cervical: No cervical adenopathy.   Skin:    General: Skin is warm and dry.     Coloration: Skin is not jaundiced.     Findings: Lesion (cyst noted to R lateral epicanthus of eye) present. No bruising, erythema or rash.   Neurological:     General: No focal deficit present.     Mental Status: He is alert and oriented to person, place, and time.     Sensory: No sensory deficit.     Motor: No weakness.   Psychiatric:        Mood and Affect: Mood normal.        Behavior: Behavior normal.      No results found for any visits on 03/23/24.  Last CBC Lab Results  Component Value Date   WBC 7.1 06/23/2023   HGB 13.5 06/23/2023   HCT 41.0 06/23/2023   MCV 91.3 06/23/2023   MCH 30.1 06/23/2023   RDW 14.1 06/23/2023   PLT 295 06/23/2023   Last metabolic panel Lab Results  Component Value Date   GLUCOSE 194 (H) 11/06/2023   NA 139 11/06/2023   K 4.2 11/06/2023   CL 102 11/06/2023   CO2 21 11/06/2023   BUN 22 11/06/2023   CREATININE 1.60 (H) 11/06/2023   EGFR 46 (L) 11/06/2023  CALCIUM 9.9 11/06/2023   PROT 6.8 11/06/2023   ALBUMIN 4.4 11/06/2023   LABGLOB 2.4 11/06/2023   BILITOT 0.4 11/06/2023   ALKPHOS 43 (L) 11/06/2023   AST 16 11/06/2023   ALT 17 11/06/2023   ANIONGAP 9 06/23/2023   Last lipids Lab Results  Component Value Date   CHOL 132 11/06/2023   HDL 42 11/06/2023   LDLCALC 64 11/06/2023   TRIG 148 11/06/2023   CHOLHDL 3.1 11/06/2023   Last hemoglobin A1c Lab Results  Component Value Date    HGBA1C 9.0 (H) 11/06/2023   Last thyroid functions No results found for: TSH, T3TOTAL, T4TOTAL, THYROIDAB Last vitamin D No results found for: 25OHVITD2, 25OHVITD3, VD25OH Last vitamin B12 and Folate No results found for: VITAMINB12, FOLATE    The 10-year ASCVD risk score (Arnett DK, et al., 2019) is: 23.4%  Assessment & Plan:  Screening for colon cancer -     Cologuard  3+ pitting edema  Type 2 diabetes mellitus with hyperglycemia, with long-term current use of insulin (HCC) -     Hemoglobin A1c  Primary hypertension -     Comprehensive metabolic panel with GFR -     CBC with Differential/Platelet -     TSH -     Chlorthalidone ; Take 2 tablets (50 mg total) by mouth daily.  Dispense: 180 tablet; Refill: 3 -     Prazosin  HCl; Take 1 capsule (1 mg total) by mouth 2 (two) times daily.  Dispense: 180 capsule; Refill: 3 -     Spironolactone ; Take 1 tablet (25 mg total) by mouth daily.  Dispense: 100 tablet; Refill: 2  Diabetic peripheral neuropathy associated with type 2 diabetes mellitus (HCC) -     B12 and Folate Panel -     Vitamin B1 -     Ambulatory referral to Podiatry -     Gabapentin ; Take 1 capsule (300 mg total) by mouth 3 (three) times daily.  Dispense: 300 capsule; Refill: 2  Hyperlipidemia, unspecified hyperlipidemia type -     Lipid panel  Nocturia -     Prazosin  HCl; Take 1 capsule (1 mg total) by mouth 2 (two) times daily.  Dispense: 180 capsule; Refill: 3 -     Tamsulosin  HCl; Take 1 capsule (0.4 mg total) by mouth daily.  Dispense: 100 capsule; Refill: 2  Depression, major, single episode, moderate (HCC) -     Escitalopram  Oxalate; Take 1 tablet (20 mg total) by mouth daily.  Dispense: 90 tablet; Refill: 0  Chronic kidney disease, stage 3a (HCC)  Lower extremity edema -     Furosemide ; Take 1 tablet (20 mg total) by mouth daily.  Dispense: 100 tablet; Refill: 2  Sleep apnea, unspecified type  Assessment and Plan    Knee  Pain Chronic knee pain due to arthritis. Previous vascular procedure ineffective. No knee replacement planned. - Continue current pain management. - Advise on appropriate footwear.  Type 2 Diabetes Mellitus Type 2 diabetes with A1c of 9.0 six months ago. Poor dietary habits and occasional hypoglycemia. Discussed dawn phenomenon and potential medication adjustment. Considered nateglinide for postprandial hyperglycemia. - Recheck A1c today. - Monitor blood glucose, especially fasting. - Consider medication adjustment if hypoglycemia frequent. - Discuss potential addition of nateglinide.  Peripheral Neuropathy Peripheral neuropathy likely secondary to diabetes. Symptoms include numbness in feet. Gabapentin  used for symptom control. - Continue gabapentin  - pt is only taking Q HS rather than TID as prescribed - Refer to podiatry for foot care. - Consider  physical or aquatic therapy.  Hypertension Hypertension well-controlled with current regimen. Lasix  used for edema management. - Continue current antihypertensive regimen, although suspect some of his nocturia is due to three separate diuretics. Consider cessation of one pending workup results.  Thoracic Aortic Aneurysm Monitored by Dr. Audery Blazing. Annual cardiologist follow-up. - Continue annual cardiologist follow-up.  Benign Prostatic Hyperplasia (BPH) Managed with tamsulosin  and prazosin . Reports frequent urination. - Continue tamsulosin .  B12 Deficiency B12 deficiency, not on supplementation. Alcohol intake may contribute. - Check B12 and B1 levels.  Eye Cyst Cyst on right eye, similar to previous left eye cyst. - Apply warm compresses. - Use [employer] baby shampoo for cleansing.  Sleep Apnea Uses CPAP mask but reports persistent fatigue. - Consider further evaluation if symptoms persist.  General Health Maintenance Due for colorectal cancer screening. Previous Cologuard test inconclusive. - Repeat Cologuard test,  ensure early week shipping.  Follow-up Follow-up needed for diabetes management and other concerns. - Schedule follow-up in six weeks. - Refer to podiatry for foot care. - Review medication list and ensure prescriptions are current.         Return in about 6 weeks (around 05/04/2024).   Mandy Second, PA

## 2024-03-24 ENCOUNTER — Ambulatory Visit: Payer: Self-pay | Admitting: Urgent Care

## 2024-03-24 LAB — CBC WITH DIFFERENTIAL/PLATELET
EOS (ABSOLUTE): 0.3 10*3/uL (ref 0.0–0.4)
Hematocrit: 40.5 % (ref 37.5–51.0)
Hemoglobin: 12.7 g/dL — ABNORMAL LOW (ref 13.0–17.7)
MCH: 29.7 pg (ref 26.6–33.0)
MCHC: 31.4 g/dL — ABNORMAL LOW (ref 31.5–35.7)
Monocytes: 10 %
Neutrophils: 45 %
Platelets: 279 10*3/uL (ref 150–450)

## 2024-03-24 LAB — COMPREHENSIVE METABOLIC PANEL WITH GFR
BUN: 34 mg/dL — ABNORMAL HIGH (ref 8–27)
Bilirubin Total: 0.4 mg/dL (ref 0.0–1.2)
CO2: 19 mmol/L — ABNORMAL LOW (ref 20–29)
Chloride: 103 mmol/L (ref 96–106)
Glucose: 123 mg/dL — ABNORMAL HIGH (ref 70–99)
Sodium: 141 mmol/L (ref 134–144)
Total Protein: 6.8 g/dL (ref 6.0–8.5)

## 2024-03-24 LAB — TSH: TSH: 2.33 u[IU]/mL (ref 0.450–4.500)

## 2024-03-24 LAB — LIPID PANEL
Chol/HDL Ratio: 5.7 ratio — ABNORMAL HIGH (ref 0.0–5.0)
Triglycerides: 218 mg/dL — ABNORMAL HIGH (ref 0–149)
VLDL Cholesterol Cal: 41 mg/dL — ABNORMAL HIGH (ref 5–40)

## 2024-03-29 ENCOUNTER — Telehealth: Payer: Self-pay

## 2024-03-29 NOTE — Telephone Encounter (Signed)
 Pt.notified

## 2024-03-29 NOTE — Telephone Encounter (Signed)
 Forwarding to [place] as an Financial planner.  Tiffany  [employer] PAP shipment for Ozempic  2 mg / 4 boxes, Novolog Flexpen 100 u / 8 boxes, Tresiba 100 u / 2 boxes and NovoFine 32 g needles / 4 boxes received this morning. Please contact the patient to come and pick up their order today. Placed in the PAP fridge with patient identifier. Thanks in advance.   Ozempic  NDC: 9830-5227-87 LOT: [id number] EXP: 2026-09-04  Novolog  NDC: 9830-3660-89 LOT: [id number] EXP: 2026-06-05  [place] NDC: 9830-7339-84 LOT: [id number] EXP: 2026-07-05  NovoFine 32 g needles [id number]  LOT: [id number] EXP: 2028-07-05

## 2024-03-31 LAB — B12 AND FOLATE PANEL
Folate: 10.5 ng/mL (ref >3.0)
Vitamin B-12: 246 pg/mL (ref 232–1245)

## 2024-03-31 LAB — LIPID PANEL
Cholesterol, Total: 250 mg/dL — ABNORMAL HIGH (ref 100–199)
HDL: 44 mg/dL (ref >39)
LDL Chol Calc (NIH): 165 mg/dL — ABNORMAL HIGH (ref 0–99)

## 2024-03-31 LAB — CBC WITH DIFFERENTIAL/PLATELET
Basophils Absolute: 0 10*3/uL (ref 0.0–0.2)
Basos: 1 %
Eos: 5 %
Immature Grans (Abs): 0 10*3/uL (ref 0.0–0.1)
Immature Granulocytes: 0 %
Lymphocytes Absolute: 2.1 10*3/uL (ref 0.7–3.1)
Lymphs: 39 %
MCV: 95 fL (ref 79–97)
Monocytes Absolute: 0.6 10*3/uL (ref 0.1–0.9)
Neutrophils Absolute: 2.5 10*3/uL (ref 1.4–7.0)
RBC: 4.27 x10E6/uL (ref 4.14–5.80)
RDW: 13.6 % (ref 11.6–15.4)
WBC: 5.5 10*3/uL (ref 3.4–10.8)

## 2024-03-31 LAB — COMPREHENSIVE METABOLIC PANEL WITH GFR
ALT: 13 IU/L (ref 0–44)
AST: 16 IU/L (ref 0–40)
Albumin: 4.5 g/dL (ref 3.9–4.9)
Alkaline Phosphatase: 42 IU/L — ABNORMAL LOW (ref 44–121)
BUN/Creatinine Ratio: 16 (ref 10–24)
Calcium: 10.1 mg/dL (ref 8.6–10.2)
Creatinine, Ser: 2.16 mg/dL — ABNORMAL HIGH (ref 0.76–1.27)
Globulin, Total: 2.3 g/dL (ref 1.5–4.5)
Potassium: 4.6 mmol/L (ref 3.5–5.2)
eGFR: 32 mL/min/{1.73_m2} — ABNORMAL LOW (ref >59)

## 2024-03-31 LAB — VITAMIN B1: Thiamine: 100.5 nmol/L (ref 66.5–200.0)

## 2024-03-31 LAB — HEMOGLOBIN A1C
Est. average glucose Bld gHb Est-mCnc: 183 mg/dL
Hgb A1c MFr Bld: 8 % — ABNORMAL HIGH (ref 4.8–5.6)

## 2024-04-05 DIAGNOSIS — Z1211 Encounter for screening for malignant neoplasm of colon: Secondary | ICD-10-CM | POA: Diagnosis not present

## 2024-04-06 LAB — HM DIABETES EYE EXAM

## 2024-04-10 ENCOUNTER — Other Ambulatory Visit: Payer: Self-pay | Admitting: Medical-Surgical

## 2024-04-12 ENCOUNTER — Other Ambulatory Visit: Payer: Self-pay

## 2024-04-12 DIAGNOSIS — Z794 Long term (current) use of insulin: Secondary | ICD-10-CM

## 2024-04-12 MED ORDER — FREESTYLE LIBRE 2 SENSOR MISC: 2 refills | Status: DC

## 2024-04-17 LAB — COLOGUARD: COLOGUARD: NEGATIVE

## 2024-04-19 ENCOUNTER — Other Ambulatory Visit: Payer: Self-pay | Admitting: Urgent Care

## 2024-04-19 MED ORDER — FENOFIBRATE 160 MG PO TABS: 160.0000 mg | ORAL_TABLET | Freq: Every day | ORAL | 3 refills | Status: AC

## 2024-04-19 NOTE — Progress Notes (Signed)
 One year supply request from [facility] received for fenofibrate . Called in 90 with 3 RF.

## 2024-04-25 ENCOUNTER — Telehealth: Payer: Self-pay | Admitting: Pharmacy Technician

## 2024-04-25 NOTE — Progress Notes (Signed)
   04/25/2024  Patient ID: Danny Montoya, male   DOB: [birth year], 73 y.o.   MRN: [id number]  Patient engaged with clinical pharmacist for management of diabetes on 02/23/2024. Outreach by [employer] technician was requested.   Outreached patient to discuss diabetes medication management. Left voicemail for patient to return my call at their convenience.    Telecia Larocque, CPhT [facility] Office: [phone] Email: [email or web address]

## 2024-04-27 ENCOUNTER — Telehealth: Payer: Self-pay | Admitting: Pharmacy Technician

## 2024-04-27 NOTE — Progress Notes (Signed)
   04/27/2024  Patient ID: Danny Montoya, male   DOB: Feb 20, 1951, 73 y.o.   MRN: [id number]  Patient engaged with clinical pharmacist for management of diabetes on 02/23/2024. 2nd outreach by [facility] was requested.   Outreached patient to discuss diabetes medication management. Left voicemail for patient to return my call at their convenience.   [name], CPhT [facility] Office: [phone] Email: [email or web address]

## 2024-04-29 ENCOUNTER — Telehealth: Payer: Self-pay | Admitting: Pharmacy Technician

## 2024-04-29 NOTE — Progress Notes (Signed)
   04/29/2024  Patient ID: Danny Montoya, male   DOB: 23-Sep-1951, 73 y.o.   MRN: [id number]  Patient engaged with clinical pharmacist for management of diabetes on 02/23/2024. 3rd outreach by [facility] technician was requested.   Outreached patient to discuss diabetes medication management. Left voicemail for patient to return my call at their convenience. Sent patient mychart message today.  Leilyn Frayre, CPhT [facility] Office: [phone] Email: [email or web address]

## 2024-05-02 ENCOUNTER — Telehealth: Payer: Self-pay | Admitting: Pharmacy Technician

## 2024-05-02 NOTE — Progress Notes (Signed)
   05/02/2024  Patient ID: Danny Montoya, male   DOB: 1951/03/31, 73 y.o.   MRN: [id number]  Patient engaged with clinical pharmacist for management of diabetes on 02/23/2024. Outreach by [employer] technician was requested.   Patient returned call and left voicemail for me Friday. This morning outreached patient to discuss diabetes medication management. Left voicemail for patient to return my call at their convenience.   Leon Goodnow, CPhT [facility] Office: [phone] Email: [email or web address]

## 2024-05-04 ENCOUNTER — Ambulatory Visit (INDEPENDENT_AMBULATORY_CARE_PROVIDER_SITE_OTHER): Admitting: Urgent Care

## 2024-05-04 ENCOUNTER — Encounter: Payer: Self-pay | Admitting: Urgent Care

## 2024-05-04 VITALS — BP 100/62 | HR 111 | Ht 73.0 in | Wt 306.0 lb

## 2024-05-04 DIAGNOSIS — E785 Hyperlipidemia, unspecified: Secondary | ICD-10-CM

## 2024-05-04 DIAGNOSIS — E1165 Type 2 diabetes mellitus with hyperglycemia: Secondary | ICD-10-CM

## 2024-05-04 DIAGNOSIS — I10 Essential (primary) hypertension: Secondary | ICD-10-CM

## 2024-05-04 DIAGNOSIS — E1142 Type 2 diabetes mellitus with diabetic polyneuropathy: Secondary | ICD-10-CM | POA: Diagnosis not present

## 2024-05-04 DIAGNOSIS — Z6841 Body Mass Index (BMI) 40.0 and over, adult: Secondary | ICD-10-CM

## 2024-05-04 DIAGNOSIS — D649 Anemia, unspecified: Secondary | ICD-10-CM

## 2024-05-04 DIAGNOSIS — M170 Bilateral primary osteoarthritis of knee: Secondary | ICD-10-CM | POA: Diagnosis not present

## 2024-05-04 DIAGNOSIS — Z794 Long term (current) use of insulin: Secondary | ICD-10-CM

## 2024-05-04 DIAGNOSIS — E66813 Obesity, class 3: Secondary | ICD-10-CM

## 2024-05-04 NOTE — Patient Instructions (Addendum)
 STOP LASIX  AND STOP SPIRONOLACTONE  STAY OFF METFORMIN   INCREASE TRESIBA to 33 units nightly for one week. Monitor your glucose levels and IF you do not achieve 120 fasting in the morning, after one week increase to 36 units nightly.  Return in one week fasting.  For your knees, use otc diclofenac gel up to four times daily. You can do tylenol  arthritis as well. Please get set up with aquatic therapy.

## 2024-05-04 NOTE — Progress Notes (Unsigned)
   Established Patient Office Visit  Subjective:  Patient ID: Izek Corvino, male    DOB: 07/27/1951  Age: 73 y.o. MRN: [id number]  No chief complaint on file.   HPI  {History (Optional):23778}  ROS: as noted in HPI  Objective:     There were no vitals taken for this visit. BP Readings from Last 3 Encounters:  03/23/24 103/70  11/06/23 92/60  10/03/23 134/82   Wt Readings from Last 3 Encounters:  03/23/24 (!) 307 lb 12 oz (139.6 kg)  11/06/23 (!) 305 lb 3 oz (138.4 kg)  08/06/23 (!) 308 lb 12 oz (140 kg)      Physical Exam   No results found for any visits on 05/04/24.  {Labs (Optional):23779}  The 10-year ASCVD risk score (Arnett DK, et al., 2019) is: 30.7%  Assessment & Plan:  There are no diagnoses linked to this encounter.   No follow-ups on file.   Benton LITTIE Gave, PA

## 2024-05-11 ENCOUNTER — Encounter: Payer: Self-pay | Admitting: Urgent Care

## 2024-05-11 ENCOUNTER — Ambulatory Visit (INDEPENDENT_AMBULATORY_CARE_PROVIDER_SITE_OTHER): Admitting: Urgent Care

## 2024-05-11 VITALS — BP 122/71 | HR 87 | Resp 20 | Ht 73.0 in | Wt 306.0 lb

## 2024-05-11 DIAGNOSIS — M170 Bilateral primary osteoarthritis of knee: Secondary | ICD-10-CM

## 2024-05-11 DIAGNOSIS — I10 Essential (primary) hypertension: Secondary | ICD-10-CM

## 2024-05-11 DIAGNOSIS — G4709 Other insomnia: Secondary | ICD-10-CM | POA: Diagnosis not present

## 2024-05-11 DIAGNOSIS — D649 Anemia, unspecified: Secondary | ICD-10-CM

## 2024-05-11 DIAGNOSIS — E1165 Type 2 diabetes mellitus with hyperglycemia: Secondary | ICD-10-CM | POA: Diagnosis not present

## 2024-05-11 DIAGNOSIS — Z794 Long term (current) use of insulin: Secondary | ICD-10-CM | POA: Diagnosis not present

## 2024-05-11 DIAGNOSIS — N1831 Chronic kidney disease, stage 3a: Secondary | ICD-10-CM | POA: Diagnosis not present

## 2024-05-11 NOTE — Progress Notes (Unsigned)
 Established Patient Office Visit  Subjective:  Patient ID: Danny Montoya, male    DOB: 13-Jun-1951  Age: 73 y.o. MRN: [id number]  No chief complaint on file.   Pleasant 73yo male presents today primarily for CKD follow up. Last visit, was noted to have significant hypotension. Was on three diuretics concurrently. Renal function had also increased significantly. Today, he has stopped lasix , spironolactone  and metformin . Due for repeat labs. BP has been well controlled and stable and sx of hypotension have resolved with these med changes.  He also has not drank any alcohol in the past five days. Usually was drinking 4 shots of liquor nightly. Admits this has caused some degree of insomnia.  Was noted to have low Hgb last visit, with a trend downwards over the past few years. Had recent normal cologuard.  Pts primarily concern is that he is primarily sitting all day due to severe knee pain. Had prior geniculate artery embolization, but this has not been effective. He would like to talk to sports med again- had temporary improvement in his sx with txs in the past. Ultimately however he knows he needs a knee replacement. Was previously sent to Dr. Cristy, however never scheduled. Was hoping to lose some weight prior to surgery. Glycemic control had also been an issue, however his continuous glucose sensor today shows average readings in the 140 range, all within the green. He is hoping to start water activities to help with weight loss and joint pain.    Patient Active Problem List   Diagnosis Date Noted   Depression, major, single episode, moderate (HCC) 05/28/2023   Chronic kidney disease, stage 3a (HCC) 02/26/2023   Neck pain on right side 11/20/2022   Lower extremity edema 10/09/2022   Type 2 diabetes mellitus with hyperglycemia, with long-term current use of insulin (HCC) 09/11/2022   Nocturia 09/11/2022   Scrotal swelling 09/11/2022   Primary hypertension 09/11/2022   Class 3 severe obesity  due to excess calories without serious comorbidity with body mass index (BMI) of 40.0 to 44.9 in adult 09/11/2022   Sleep apnea 09/11/2022   Diabetic peripheral neuropathy associated with type 2 diabetes mellitus (HCC) 05/12/2022   Primary osteoarthritis, right shoulder 02/26/2022   Aneurysm of thoracic aorta (HCC) 12/17/2021   Sensorineural hearing loss (SNHL) of both ears 07/24/2020   Abnormal results of kidney function studies 06/14/2020   Primary osteoarthritis of both knees 09/11/2016   Past Medical History:  Diagnosis Date   Arthritis    Diabetes mellitus (HCC)    Gastroesophageal reflux disease    Heart murmur 2021   Hyperlipidemia    Hypertension    Obstructive sleep apnea    Sleep apnea 10 years   Past Surgical History:  Procedure Laterality Date   IR ANGIOGRAM EXTREMITY LEFT  06/23/2023   IR ANGIOGRAM SELECTIVE EACH ADDITIONAL VESSEL  06/23/2023   IR EMBO ARTERIAL NOT HEMORR HEMANG INC GUIDE ROADMAPPING  06/23/2023   IR RADIOLOGIST EVAL & MGMT  05/04/2023   IR RADIOLOGIST EVAL & MGMT  07/22/2023   IR US  GUIDE VASC ACCESS RIGHT  06/23/2023   NECK SURGERY     Social History   Tobacco Use   Smoking status: Never   Smokeless tobacco: Never  Vaping Use   Vaping status: Never Used  Substance Use Topics   Alcohol use: Yes    Alcohol/week: 21.0 standard drinks of alcohol    Types: 21 Shots of liquor per week   Drug use: Not Currently  ROS: as noted in HPI  Objective:     BP 122/71 (BP Location: Left Arm, Patient Position: Sitting, Cuff Size: Large)   Pulse 87   Resp 20   Ht 6' 1 (1.854 m)   Wt (!) 306 lb (138.8 kg)   SpO2 97%   BMI 40.37 kg/m  BP Readings from Last 3 Encounters:  05/11/24 122/71  05/04/24 100/62  03/23/24 103/70   Wt Readings from Last 3 Encounters:  05/11/24 (!) 306 lb (138.8 kg)  05/04/24 (!) 306 lb (138.8 kg)  03/23/24 (!) 307 lb 12 oz (139.6 kg)      Physical Exam Vitals and nursing note reviewed.  Constitutional:       General: He is not in acute distress.    Appearance: Normal appearance. He is not ill-appearing, toxic-appearing or diaphoretic.  HENT:     Head: Normocephalic and atraumatic.     Right Ear: External ear normal.     Left Ear: External ear normal.     Nose: Nose normal.  Eyes:     General: No scleral icterus.    Pupils: Pupils are equal, round, and reactive to light.  Cardiovascular:     Rate and Rhythm: Normal rate.  Pulmonary:     Effort: Pulmonary effort is normal. No respiratory distress.  Skin:    General: Skin is warm and dry.     Findings: No erythema or rash.  Neurological:     General: No focal deficit present.     Mental Status: He is alert and oriented to person, place, and time.      No results found for any visits on 05/11/24.   The 10-year ASCVD risk score (Arnett DK, et al., 2019) is: 39.1%  Assessment & Plan:  Type 2 diabetes mellitus with hyperglycemia, with long-term current use of insulin (HCC) -     Basic metabolic panel with GFR  Primary hypertension  Chronic kidney disease, stage 3a (HCC) -     Basic metabolic panel with GFR  Low hemoglobin -     CBC with Differential/Platelet -     Iron, TIBC and Ferritin Panel  Primary osteoarthritis of both knees -     Ambulatory referral to Sports Medicine -     Ambulatory referral to Orthopedics  Other insomnia  Glucose control significantly improving on current therapy. Pt has also made some positive lifestyle/ dietary changes and has cut out alcohol.  BP was too low previously, he has stopped two diuretics and BP now at goal range.  CKD - repeat today after stopping three meds likely negatively affecting renal function previously.   OA - would like consult with sports med. Has had temporary pain relief with their tx modalities. Also would like repeat referral to orthopedic surgery - would like to discuss possible TKA.   Insomnia - withdrawing from ETOH it appears.  Recommended trying melatonin 10mg   first, may consider trazodone upon follow up if needed.  Follow up in 7 weeks for repeat A1C testing.  Return in about 7 weeks (around 06/27/2024).   Benton LITTIE Gave, PA

## 2024-05-11 NOTE — Patient Instructions (Addendum)
 Try melatonin for your sleep. Take 10mg  nightly, 30 minutes before going to bed.  Please schedule a follow up visit with Dr. ONEIDA for your knees. I have also placed a referral to Dr. Cristy to discuss possible replacement.  Please schedule a follow up visit after 06/24/24 for diabetes management.

## 2024-05-12 ENCOUNTER — Encounter: Payer: Self-pay | Admitting: Urgent Care

## 2024-05-12 ENCOUNTER — Ambulatory Visit: Payer: Self-pay | Admitting: Urgent Care

## 2024-05-12 LAB — CBC WITH DIFFERENTIAL/PLATELET
Basophils Absolute: 0 x10E3/uL (ref 0.0–0.2)
Basos: 1 %
EOS (ABSOLUTE): 0.3 x10E3/uL (ref 0.0–0.4)
Eos: 4 %
Hematocrit: 42.4 % (ref 37.5–51.0)
Hemoglobin: 13.4 g/dL (ref 13.0–17.7)
Immature Grans (Abs): 0 x10E3/uL (ref 0.0–0.1)
Immature Granulocytes: 0 %
Lymphocytes Absolute: 2.5 x10E3/uL (ref 0.7–3.1)
Lymphs: 39 %
MCH: 30.5 pg (ref 26.6–33.0)
MCHC: 31.6 g/dL (ref 31.5–35.7)
MCV: 97 fL (ref 79–97)
Monocytes Absolute: 0.6 x10E3/uL (ref 0.1–0.9)
Monocytes: 9 %
Neutrophils Absolute: 3.1 x10E3/uL (ref 1.4–7.0)
Neutrophils: 47 %
Platelets: 291 x10E3/uL (ref 150–450)
RBC: 4.39 x10E6/uL (ref 4.14–5.80)
RDW: 13.2 % (ref 11.6–15.4)
WBC: 6.5 x10E3/uL (ref 3.4–10.8)

## 2024-05-12 LAB — IRON,TIBC AND FERRITIN PANEL
Ferritin: 520 ng/mL — ABNORMAL HIGH (ref 30–400)
Iron Saturation: 21 % (ref 15–55)
Iron: 92 ug/dL (ref 38–169)
Total Iron Binding Capacity: 440 ug/dL (ref 250–450)
UIBC: 348 ug/dL — ABNORMAL HIGH (ref 111–343)

## 2024-05-12 LAB — BASIC METABOLIC PANEL WITH GFR
BUN/Creatinine Ratio: 21 (ref 10–24)
BUN: 35 mg/dL — ABNORMAL HIGH (ref 8–27)
CO2: 20 mmol/L (ref 20–29)
Calcium: 10.4 mg/dL — ABNORMAL HIGH (ref 8.6–10.2)
Chloride: 103 mmol/L (ref 96–106)
Creatinine, Ser: 1.63 mg/dL — ABNORMAL HIGH (ref 0.76–1.27)
Glucose: 148 mg/dL — ABNORMAL HIGH (ref 70–99)
Potassium: 4.7 mmol/L (ref 3.5–5.2)
Sodium: 139 mmol/L (ref 134–144)
eGFR: 45 mL/min/1.73 — ABNORMAL LOW (ref >59)

## 2024-05-16 ENCOUNTER — Other Ambulatory Visit: Payer: Self-pay | Admitting: Medical-Surgical

## 2024-05-19 ENCOUNTER — Encounter: Payer: Self-pay | Admitting: Podiatry

## 2024-05-19 ENCOUNTER — Ambulatory Visit: Admitting: Podiatry

## 2024-05-19 DIAGNOSIS — M79674 Pain in right toe(s): Secondary | ICD-10-CM | POA: Diagnosis not present

## 2024-05-19 DIAGNOSIS — B351 Tinea unguium: Secondary | ICD-10-CM

## 2024-05-19 DIAGNOSIS — E1142 Type 2 diabetes mellitus with diabetic polyneuropathy: Secondary | ICD-10-CM

## 2024-05-19 DIAGNOSIS — M79675 Pain in left toe(s): Secondary | ICD-10-CM

## 2024-05-19 DIAGNOSIS — E119 Type 2 diabetes mellitus without complications: Secondary | ICD-10-CM

## 2024-05-19 NOTE — Progress Notes (Signed)
  Subjective:  Patient ID: Danny Montoya, male    DOB: 1951/06/17,   MRN: [id number]  Chief Complaint  Patient presents with   Diabetes    I can't cut my nails and I have ingrown toenails on the both big toenails.  Saw Whitney Crain - 098/03/2024; A1c - 8.0    73 y.o. male presents for concern of thickened elongated and painful nails that are difficult to trim. Requesting to have them trimmed today. Relates burning and tingling in their feet. Patient is diabetic and last A1c was  Lab Results  Component Value Date   HGBA1C 8.0 (H) 03/23/2024   .   PCP:  Lowella Benton CROME, PA    . Denies any other pedal complaints. Denies n/v/f/c.   Past Medical History:  Diagnosis Date   Arthritis    Diabetes mellitus (HCC)    Gastroesophageal reflux disease    Heart murmur 2021   Hyperlipidemia    Hypertension    Obstructive sleep apnea    Sleep apnea 10 years    Objective:  Physical Exam: Vascular: DP/PT pulses 2/4 bilateral. CFT <3 seconds. Absent hair growth on digits. Edema noted to bilateral lower extremities. Xerosis noted bilaterally.  Skin. No lacerations or abrasions bilateral feet. Nails 1-5 bilateral  are thickened discolored and elongated with subungual debris.  Musculoskeletal: MMT 5/5 bilateral lower extremities in DF, PF, Inversion and Eversion. Deceased ROM in DF of ankle joint.  Neurological: Sensation intact to light touch. Protective sensation diminished bilateral.    Assessment:   1. Pain due to onychomycosis of toenails of both feet   2. Type 2 diabetes mellitus with peripheral neuropathy (HCC)   3. Encounter for diabetic foot exam (HCC)      Plan:  Patient was evaluated and treated and all questions answered. -Discussed and educated patient on diabetic foot care, especially with  regards to the vascular, neurological and musculoskeletal systems.  -Stressed the importance of good glycemic control and the detriment of not  controlling glucose levels in relation to the  foot. -Discussed supportive shoes at all times and checking feet regularly.  -Mechanically debrided all nails 1-5 bilateral using sterile nail nipper and filed with dremel without incident  -Answered all patient questions -Patient to return  in 3 months for at risk foot care -Patient advised to call the office if any problems or questions arise in the meantime.   Asberry Failing, DPM

## 2024-05-25 DIAGNOSIS — M25561 Pain in right knee: Secondary | ICD-10-CM | POA: Diagnosis not present

## 2024-05-25 DIAGNOSIS — M25562 Pain in left knee: Secondary | ICD-10-CM | POA: Diagnosis not present

## 2024-05-30 DIAGNOSIS — M25562 Pain in left knee: Secondary | ICD-10-CM | POA: Diagnosis not present

## 2024-05-30 DIAGNOSIS — M25561 Pain in right knee: Secondary | ICD-10-CM | POA: Diagnosis not present

## 2024-06-02 DIAGNOSIS — M25561 Pain in right knee: Secondary | ICD-10-CM | POA: Diagnosis not present

## 2024-06-02 DIAGNOSIS — M25562 Pain in left knee: Secondary | ICD-10-CM | POA: Diagnosis not present

## 2024-06-07 ENCOUNTER — Encounter: Payer: Self-pay | Admitting: Sports Medicine

## 2024-06-08 ENCOUNTER — Other Ambulatory Visit: Payer: Self-pay

## 2024-06-08 DIAGNOSIS — E1165 Type 2 diabetes mellitus with hyperglycemia: Secondary | ICD-10-CM

## 2024-06-08 MED ORDER — FREESTYLE LIBRE 2 PLUS SENSOR MISC: 4 refills | Status: AC

## 2024-06-08 NOTE — Progress Notes (Signed)
   06/08/2024  Patient ID: Danny Montoya, male   DOB: [birth year], 73 y.o.   MRN: [id number]   Subjective/Objective Telephone visit to follow-up on management of diabetes   Diabetes Management Plan -Current medications:  Novolog 30-32 units TID with meals based on BG, Tresiba 40 units daily, Ozempic  2mg  weekly -Metformin  recently stopped due to declined eGFR -Receives Tresiba, Novolog, pen needles, and Ozempic  from Novo PAP- patient currently has at least 2 pens of Ozempic  on hand -Using [place] 2 for CGM-data from last 2 weeks reflect significant improvement since our last visit: CGM active 69 % Average Glucose 148 GMI 6.9% Target Range 74%, High 20%, Very High 3%, Low 3%, Very Low 0% -A1c 8% 6/18 -Libre 2 sensors will need to be changing to Interior 2+ (2's no longer being manufactured) -Patient recently started taking Tresiba at bedtime versus in the morning, and he has increased to 40 units daily -Patient does not endorse any s/sx of hypoglycemia or hyperglycemia  Blood Pressure Control -Current medications:  lisinopril  40mg  daily, amlodipine  10mg  daily, chlorthalidone  25mg  daily, prazosin  1mg  at bedtime -Spironolactone  and furosemide  recently stopped due to declined eGFR and hypotension -Patient does not monitor home BP, but at last OV on 8/6 BP had improved at 122/71 -Does not endorse any s/sx of hypotension or hypertension  Hyperlipidemia -Current medications:  fenofibrate  160mg  daily, ezetimibe  10mg  daily -Atorvastatin 80mg  daily on patient's medication list, but he does not have and has not been taking this medication -LDL elevated at 165 on 6/18   Assessment/Plan   Diabetes Management Plan -Improved control -Continue current regimen at this time -Continue Libre 2 for CGM- orders for [facility] 2+ sensors sent to pharmacy under [facility] standing order for CGM -Patient has not had UACR since 09/2022 (elevated at 53); I recommend f/u lab at next PCP visit -Sees PCP again 9/24 and will be due  for A1c -Could consider addition of SGLT2 based on A1c and UACR- patient would likely qualify for AZ&Me PAP for Farxiga  Blood Pressure Control -Controlled -Continue current regimen at this time and regular follow-up with PCP and PharmD -I recommend obtaining home BP monitor to check and record home BP daily -Patient would like to decrease pill burden; if BP were to continue to run low, consider stopping prazosin  (if BPH controlled with tamsulosin ) since patient is not taking this as prescribed  Hyperlipidemia -Uncontrolled -I recommend resuming statin therapy with high intensity statin such as atorvastatin 80mg  or rosuvastatin 40mg  daily -Follow-up lipid panel and CMP recommended 8-12 weeks after starting -Patient would like to decrease pill burden, so it is possible with statin back on board we could eliminate ezetimibe  or fenofibrate  if LDL comes back below 70 in the future   Follow-up:  10/15   Danny Montoya, PharmD, DPLA

## 2024-06-09 ENCOUNTER — Other Ambulatory Visit: Payer: Self-pay

## 2024-06-09 DIAGNOSIS — E7800 Pure hypercholesterolemia, unspecified: Secondary | ICD-10-CM

## 2024-06-09 MED ORDER — EZETIMIBE 10 MG PO TABS: 10.0000 mg | ORAL_TABLET | Freq: Every day | ORAL | 0 refills | Status: DC

## 2024-06-13 DIAGNOSIS — M25561 Pain in right knee: Secondary | ICD-10-CM | POA: Diagnosis not present

## 2024-06-13 DIAGNOSIS — M25562 Pain in left knee: Secondary | ICD-10-CM | POA: Diagnosis not present

## 2024-06-16 ENCOUNTER — Other Ambulatory Visit: Payer: Self-pay | Admitting: Urgent Care

## 2024-06-16 DIAGNOSIS — M25562 Pain in left knee: Secondary | ICD-10-CM | POA: Diagnosis not present

## 2024-06-16 DIAGNOSIS — F321 Major depressive disorder, single episode, moderate: Secondary | ICD-10-CM

## 2024-06-16 DIAGNOSIS — M25561 Pain in right knee: Secondary | ICD-10-CM | POA: Diagnosis not present

## 2024-06-20 DIAGNOSIS — M25562 Pain in left knee: Secondary | ICD-10-CM | POA: Diagnosis not present

## 2024-06-22 ENCOUNTER — Telehealth: Payer: Self-pay

## 2024-06-22 NOTE — Telephone Encounter (Signed)
 Received Ozempic  2mg - 4 pens ( 4 boxes - one pen per box)  form patient assistance  Left a detailed voice mail message on patient listed home #  that Ozempic  has been received and he can pick up in our office at his convenience.

## 2024-06-29 ENCOUNTER — Ambulatory Visit: Admitting: Urgent Care

## 2024-07-07 ENCOUNTER — Ambulatory Visit: Admitting: Urgent Care

## 2024-07-07 VITALS — BP 125/70 | HR 76 | Ht 73.0 in | Wt 313.0 lb

## 2024-07-07 DIAGNOSIS — G4709 Other insomnia: Secondary | ICD-10-CM | POA: Diagnosis not present

## 2024-07-07 DIAGNOSIS — E1142 Type 2 diabetes mellitus with diabetic polyneuropathy: Secondary | ICD-10-CM

## 2024-07-07 DIAGNOSIS — E7800 Pure hypercholesterolemia, unspecified: Secondary | ICD-10-CM | POA: Diagnosis not present

## 2024-07-07 DIAGNOSIS — I10 Essential (primary) hypertension: Secondary | ICD-10-CM

## 2024-07-07 DIAGNOSIS — F321 Major depressive disorder, single episode, moderate: Secondary | ICD-10-CM

## 2024-07-07 DIAGNOSIS — E785 Hyperlipidemia, unspecified: Secondary | ICD-10-CM | POA: Diagnosis not present

## 2024-07-07 DIAGNOSIS — Z23 Encounter for immunization: Secondary | ICD-10-CM

## 2024-07-07 DIAGNOSIS — N1831 Chronic kidney disease, stage 3a: Secondary | ICD-10-CM | POA: Diagnosis not present

## 2024-07-07 DIAGNOSIS — E1165 Type 2 diabetes mellitus with hyperglycemia: Secondary | ICD-10-CM

## 2024-07-07 DIAGNOSIS — Z794 Long term (current) use of insulin: Secondary | ICD-10-CM

## 2024-07-07 DIAGNOSIS — M170 Bilateral primary osteoarthritis of knee: Secondary | ICD-10-CM | POA: Diagnosis not present

## 2024-07-07 MED ORDER — TRAMADOL HCL 50 MG PO TABS: 50.0000 mg | ORAL_TABLET | Freq: Three times a day (TID) | ORAL | 0 refills | Status: AC | PRN

## 2024-07-07 MED ORDER — EZETIMIBE 10 MG PO TABS: 10.0000 mg | ORAL_TABLET | Freq: Every day | ORAL | 2 refills | Status: DC

## 2024-07-07 MED ORDER — TIZANIDINE HCL 4 MG PO TABS: 4.0000 mg | ORAL_TABLET | Freq: Four times a day (QID) | ORAL | 5 refills | Status: AC | PRN

## 2024-07-07 MED ORDER — ESCITALOPRAM OXALATE 20 MG PO TABS
20.0000 mg | ORAL_TABLET | Freq: Every day | ORAL | 2 refills | Status: AC
Start: 2024-07-07 — End: ?

## 2024-07-07 NOTE — Progress Notes (Unsigned)
   Established Patient Office Visit  Subjective:  Patient ID: Danny Montoya, male    DOB: January 31, 1951  Age: 73 y.o. MRN: [id number]  Chief Complaint  Patient presents with  . Diabetes    fasting  . Knee Pain    HPI  {History (Optional):23778}  ROS: as noted in HPI  Objective:     BP 125/70   Pulse 76   Ht 6' 1 (1.854 m)   Wt (!) 313 lb (142 kg)   SpO2 97%   BMI 41.30 kg/m  BP Readings from Last 3 Encounters:  07/07/24 125/70  05/11/24 122/71  05/04/24 100/62   Wt Readings from Last 3 Encounters:  07/07/24 (!) 313 lb (142 kg)  05/11/24 (!) 306 lb (138.8 kg)  05/04/24 (!) 306 lb (138.8 kg)      Physical Exam   No results found for any visits on 07/07/24.  {Labs (Optional):23779}  The 10-year ASCVD risk score (Arnett DK, et al., 2019) is: 42.4%  Assessment & Plan:  Type 2 diabetes mellitus with hyperglycemia, with long-term current use of insulin (HCC)  Primary hypertension  Hyperlipidemia, unspecified hyperlipidemia type  Diabetic peripheral neuropathy associated with type 2 diabetes mellitus (HCC)  Other insomnia  Primary osteoarthritis of both knees  Chronic kidney disease, stage 3a (HCC)  Hypercalcemia  Need for pneumococcal vaccine  Need for zoster vaccination     No follow-ups on file.   Benton LITTIE Gave, PA

## 2024-07-07 NOTE — Patient Instructions (Addendum)
 We updated your labs today.  Use tramadol  as needed for severe knee pain. Please follow up with Dr. Cristy. If your kidney function has improved, we will call in celebrex .  I have refilled your chronic medications.  Please return in 3-4 months.

## 2024-07-08 ENCOUNTER — Ambulatory Visit: Payer: Self-pay | Admitting: Urgent Care

## 2024-07-08 ENCOUNTER — Encounter: Payer: Self-pay | Admitting: Urgent Care

## 2024-07-08 DIAGNOSIS — E785 Hyperlipidemia, unspecified: Secondary | ICD-10-CM

## 2024-07-08 DIAGNOSIS — E559 Vitamin D deficiency, unspecified: Secondary | ICD-10-CM

## 2024-07-08 MED ORDER — VITAMIN D (ERGOCALCIFEROL) 1.25 MG (50000 UNIT) PO CAPS: 50000.0000 [IU] | ORAL_CAPSULE | ORAL | 0 refills | Status: AC

## 2024-07-09 LAB — CMP14+EGFR
ALT: 13 IU/L (ref 0–44)
AST: 14 IU/L (ref 0–40)
Albumin: 4.3 g/dL (ref 3.8–4.8)
Alkaline Phosphatase: 45 IU/L — ABNORMAL LOW (ref 47–123)
BUN/Creatinine Ratio: 15 (ref 10–24)
BUN: 25 mg/dL (ref 8–27)
Bilirubin Total: 0.4 mg/dL (ref 0.0–1.2)
CO2: 23 mmol/L (ref 20–29)
Calcium: 10.1 mg/dL (ref 8.6–10.2)
Chloride: 100 mmol/L (ref 96–106)
Creatinine, Ser: 1.67 mg/dL — ABNORMAL HIGH (ref 0.76–1.27)
Globulin, Total: 2.6 g/dL (ref 1.5–4.5)
Glucose: 162 mg/dL — ABNORMAL HIGH (ref 70–99)
Potassium: 4.7 mmol/L (ref 3.5–5.2)
Sodium: 138 mmol/L (ref 134–144)
Total Protein: 6.9 g/dL (ref 6.0–8.5)
eGFR: 43 mL/min/1.73 — ABNORMAL LOW (ref >59)

## 2024-07-09 LAB — CBC WITH DIFFERENTIAL/PLATELET
Basophils Absolute: 0 x10E3/uL (ref 0.0–0.2)
Basos: 1 %
EOS (ABSOLUTE): 0.2 x10E3/uL (ref 0.0–0.4)
Eos: 5 %
Hematocrit: 39.7 % (ref 37.5–51.0)
Hemoglobin: 12.8 g/dL — ABNORMAL LOW (ref 13.0–17.7)
Immature Grans (Abs): 0 x10E3/uL (ref 0.0–0.1)
Immature Granulocytes: 0 %
Lymphocytes Absolute: 1.8 x10E3/uL (ref 0.7–3.1)
Lymphs: 38 %
MCH: 30.3 pg (ref 26.6–33.0)
MCHC: 32.2 g/dL (ref 31.5–35.7)
MCV: 94 fL (ref 79–97)
Monocytes Absolute: 0.5 x10E3/uL (ref 0.1–0.9)
Monocytes: 11 %
Neutrophils Absolute: 2.2 x10E3/uL (ref 1.4–7.0)
Neutrophils: 45 %
Platelets: 335 x10E3/uL (ref 150–450)
RBC: 4.23 x10E6/uL (ref 4.14–5.80)
RDW: 12.5 % (ref 11.6–15.4)
WBC: 4.7 x10E3/uL (ref 3.4–10.8)

## 2024-07-09 LAB — HEMOGLOBIN A1C
Est. average glucose Bld gHb Est-mCnc: 174 mg/dL
Hgb A1c MFr Bld: 7.7 % — ABNORMAL HIGH (ref 4.8–5.6)

## 2024-07-09 LAB — VITAMIN D 25 HYDROXY (VIT D DEFICIENCY, FRACTURES): Vit D, 25-Hydroxy: 14.7 ng/mL — ABNORMAL LOW (ref 30.0–100.0)

## 2024-07-09 LAB — LIPID PANEL
Chol/HDL Ratio: 4.6 ratio (ref 0.0–5.0)
Cholesterol, Total: 178 mg/dL (ref 100–199)
HDL: 39 mg/dL — ABNORMAL LOW (ref >39)
LDL Chol Calc (NIH): 118 mg/dL — ABNORMAL HIGH (ref 0–99)
Triglycerides: 116 mg/dL (ref 0–149)
VLDL Cholesterol Cal: 21 mg/dL (ref 5–40)

## 2024-07-09 LAB — PTH, INTACT AND CALCIUM: PTH: 35 pg/mL (ref 15–65)

## 2024-07-09 LAB — MICROALBUMIN / CREATININE URINE RATIO
Creatinine, Urine: 102 mg/dL
Microalb/Creat Ratio: 6 mg/g{creat} (ref 0–29)
Microalbumin, Urine: 6 ug/mL

## 2024-07-10 MED ORDER — ROSUVASTATIN CALCIUM 20 MG PO TABS: 20.0000 mg | ORAL_TABLET | Freq: Every day | ORAL | 1 refills | Status: DC

## 2024-07-11 ENCOUNTER — Telehealth: Payer: Self-pay | Admitting: *Deleted

## 2024-07-11 NOTE — Progress Notes (Unsigned)
 Complex Care Management Note Care Guide Note  07/11/2024 Name: Danny Montoya MRN: [id number] DOB: Jan 07, 1951   Complex Care Management Outreach Attempts: An unsuccessful telephone outreach was attempted today to offer the patient information about available complex care management services.  Follow Up Plan:  Additional outreach attempts will be made to offer the patient complex care management information and services.   Encounter Outcome:  No Answer  Thedford Franks, CMA [place]  [facility], [facility] Guide Direct Dial: [phone]  Fax: [phone] Website: [email or web address]

## 2024-07-12 NOTE — Progress Notes (Signed)
 Complex Care Management Note  Care Guide Note 07/12/2024 Name: Philbert Ocallaghan MRN: [id number] DOB: 06/09/1951  Darrelle Wiberg is a 73 y.o. year old male who sees [place], [place], [place] for primary care. I reached out to Desiderio Eans by phone today to offer complex care management services.  Mr. Cygan was given information about Complex Care Management services today including:   The Complex Care Management services include support from the care team which includes your Nurse Care Manager, Clinical Social Worker, or Pharmacist.  The Complex Care Management team is here to help remove barriers to the health concerns and goals most important to you. Complex Care Management services are voluntary, and the patient may decline or stop services at any time by request to their care team member.   Complex Care Management Consent Status: Patient agreed to services and verbal consent obtained.   Follow up plan:  Telephone appointment with complex care management team member scheduled for:  07/19/2024 and 07/27/2024  Encounter Outcome:  Patient Scheduled  Thedford Franks, CMA [place]  [facility], [facility] Guide Direct Dial: [phone]  Fax: [phone] Website: [email or web address]

## 2024-07-14 ENCOUNTER — Telehealth: Payer: Self-pay

## 2024-07-14 NOTE — Telephone Encounter (Signed)
 Patient informed and will pick up medication from patient assistance at his convenience.

## 2024-07-14 NOTE — Telephone Encounter (Signed)
 Received from patient assistance  Novolog Flexpen 100u/ml 8 boxes received ( 5 pens per box )  Attempted call to patient. Left a voice mail message requesting a return call.

## 2024-07-14 NOTE — Telephone Encounter (Signed)
 Received from patient assistance  Tresiba U-100 2 boxes received (  5 pens per box )  Attempted a call to patient. Left a voice mail message requesting a return call.

## 2024-07-14 NOTE — Telephone Encounter (Signed)
 Copied from CRM [id number]. Topic: General - Other >> Jul 14, 2024 10:13 AM Cleave MATSU wrote: Reason for CRM: pt returned call back to [facility] please reach back out to pt

## 2024-07-19 ENCOUNTER — Other Ambulatory Visit: Payer: Self-pay | Admitting: *Deleted

## 2024-07-19 ENCOUNTER — Other Ambulatory Visit: Payer: Self-pay

## 2024-07-19 DIAGNOSIS — R918 Other nonspecific abnormal finding of lung field: Secondary | ICD-10-CM

## 2024-07-19 NOTE — Patient Outreach (Signed)
 Complex Care Management   Visit Note  07/19/2024  Name:  Danny Montoya MRN: [id number] DOB: [birth year]  Situation: Referral received for Complex Care Management related to SDOH Barriers:  Transportation I obtained verbal consent from Patient.  Visit completed with Patient  on the phone  Background:   Past Medical History:  Diagnosis Date   Arthritis    Diabetes mellitus (HCC)    Gastroesophageal reflux disease    Heart murmur 2021   Hyperlipidemia    Hypertension    Obstructive sleep apnea    Sleep apnea 10 years    Assessment:  Patient reports he drives, but needs someone to attend surgery and drive him home. Patient has family that is 2 hrs away but can't help.  Patient has not scheduled surgery and isn't sure where the surgery will be preformed.  T/c [employer] but transportation is not covered. Patient is emailed a list of resources from [facility]. SW discussed [facility] and [facility] [place]. Support System provides assistance for medical procedures and transportation. Patient will follow up with resources to determine which service meets his need.    SDOH Interventions    Flowsheet Row Patient Outreach Telephone from 07/19/2024 in [place] [facility] Office Visit from 07/28/2023 in [facility] at [facility] Office Visit from 05/28/2023 in [facility] at [facility]  SDOH Interventions     Food Insecurity Interventions Intervention Not Indicated Intervention Not Indicated --  Housing Interventions Intervention Not Indicated Intervention Not Indicated --  Transportation Interventions Intervention Not Indicated  [Drives to apppointment, but referred to [facility] [place] for medical transportation after surgery] Intervention Not Indicated --  Utilities Interventions Intervention Not Indicated Intervention Not Indicated --  Alcohol Usage Interventions -- Intervention  Not Indicated (Score <7) --  Depression Interventions/Treatment  -- Currently on Treatment Medication  Financial Strain Interventions Intervention Not Indicated Intervention Not Indicated --  Physical Activity Interventions -- Intervention Not Indicated --  Stress Interventions -- Intervention Not Indicated --  Social Connections Interventions -- Patient Declined --  Health Literacy Interventions -- Intervention Not Indicated --      Recommendation:   None  Follow Up Plan:   Telephone follow up appointment date/time:  07/26/24 at 2pm  Tillman Gardener, BSW [place]  [facility], [facility] Social Worker Direct Dial: [phone]  Fax: [phone] Website: [email or web address]

## 2024-07-19 NOTE — Progress Notes (Unsigned)
   07/20/2024  Patient ID: Danny Montoya, male   DOB: 20-Jan-1951, 73 y.o.   MRN: [id number]   Subjective/Objective Telephone visit to follow-up on management of chronic disease states   Diabetes Management Plan -Current medications:  Novolog 30-32 units TID with meals based on BG, Tresiba 40 units daily, Ozempic  2mg  weekly -Metformin  recently stopped due to declined eGFR -Receives Tresiba, Novolog, pen needles, and Ozempic  from [facility] PAP- patient currently has at least 4pens of Ozempic  on hand -Using [employer] 2 for CGM-data from last 2 weeks reflect significant improvement since our last visit: CGM active 75 % Average Glucose 146 GMI 6.8% Target Range 80%, High 17%, Very High 2%, Low 1%, Very Low 0% -A1c 7.7% recently, slightly down from 8% previously -Patient recently started taking Tresiba at bedtime versus in the morning, and he has increased to 40 units daily; this seems to be improving BG while also preventing hypoglycemia -Patient does not endorse any s/sx of hypoglycemia or hyperglycemia -ACEi/ARB for cardiorenal protection:  lisinopril  40mg  daily -UACR of 6 on 10/2 -Statin for ASCVD risk reduction:  rosuvastatin 20mg  daily just recently started- most recent LDL of 118  Blood Pressure Control -Current medications:  lisinopril  40mg  daily, amlodipine  10mg  daily, chlorthalidone  50mg  daily, prazosin  1mg  at bedtime -Patient had not been taking 2 tablets of chlorthalidone  25mg  as prescribed until the past few days.  Increased dose to 50mg  due to some fluid retention in his feet and ankles. -Spironolactone  and furosemide  recently stopped due to declined eGFR and hypotension -Patient does not monitor home BP, but at last OV on 10/2 was 125/70 -Does not endorse any s/sx of hypotension or hypertension  Hyperlipidemia -Current medications:  fenofibrate  160mg  daily, ezetimibe  10mg  daily, rosuvastatin 20mg  daily -Patient started rosuvastatin approximately 1-2 weeks ago  Assessment/Plan   Diabetes  Management Plan -Improved control -BP and UACR at goal -LDL not at goal but expect improvement with rosuvastatin 20mg  daily on board -Continue current regimen at this time -Continue Libre 2 for CGM -Informed patient that Ozempic  will no longer be available through Novo PAP starting in January.  Patient does not qualify for LIS Medicare Extra Help based on HHI, so I advised looking into Medicare coverage/copay for this medication. -Patient should still receive insulins through Novo, but they may require he apply for and be denied LIS Medicare Extra Help first.  -Patient sees PCP again 2/2 and will be due for an A1c  Blood Pressure Control -Controlled -Continue current regimen at this time and regular follow-up with PCP and PharmD -I recommend obtaining home BP monitor to check and record home BP daily, especially now that he is taking 50mg  of chlorthalidone  to make sure he does not have any hypotension -Patient would like to decrease pill burden; if BP were to continue to run low, consider stopping prazosin  (if BPH controlled with tamsulosin ) since patient is not taking this as prescribed  Hyperlipidemia -Continue current regimen -Follow-up lipid panel and CMP recommended at next PCP visit -Patient would like to decrease pill burden, so it is possible with statin back on board we could eliminate ezetimibe  or fenofibrate  if LDL comes back below 70 in the future   Follow-up:  1 month to make sure Novo PAP re-enrollment for insulins is in process and to follow-up on DM and HTN control   [name], PharmD, DPLA

## 2024-07-19 NOTE — Patient Instructions (Signed)
 Visit Information  Thank you for taking time to visit with me today. Please don't hesitate to contact me if I can be of assistance to you before our next scheduled appointment.  Our next appointment is by telephone on 07/26/24 at 2pm Please call the care guide team at [phone] if you need to cancel or reschedule your appointment.   Following is a copy of your care plan:   Goals Addressed             This Visit's Progress    BSW VBCI Social Work Care Plan       Problems:   Transportation  CSW Clinical Goal(s):   Over the next 7 days the Patient will will follow up with community resources as directed by Social Work.  Interventions:  Social Determinants of Health in Patient with CKD Stage 3, DMII, and HTN: SDOH assessments completed: Transportation Evaluation of current treatment plan related to unmet needs Patient reports he drives, but needs someone to attend surgery and drive him home.  T/c [employer] but transportation is not covered.  Patient is email a list of resources.  SW discussed [facility] and [facility] [place].  Support System provides assistance for medical procedures and transportation.  Patient will follow up with resources to determine which service meets his need.   Patient Goals/Self-Care Activities:  Patient will follow up with [facility] and [facility] [place].  Plan:   Telephone follow up appointment with care management team member scheduled for:  07/26/24 at 2pm.        Please call 911 if you are experiencing a Mental Health or Behavioral Health Crisis or need someone to talk to.  Patient verbalizes understanding of instructions and care plan provided today and agrees to view in MyChart. Active MyChart status and patient understanding of how to access instructions and care plan via MyChart confirmed with patient.     Danny Montoya, BSW [place]  [facility], [facility] Social  Worker Direct Dial: [phone]  Fax: [phone] Website: [email or web address]

## 2024-07-20 ENCOUNTER — Other Ambulatory Visit: Payer: Self-pay

## 2024-07-20 DIAGNOSIS — I10 Essential (primary) hypertension: Secondary | ICD-10-CM

## 2024-07-20 DIAGNOSIS — Z794 Long term (current) use of insulin: Secondary | ICD-10-CM

## 2024-07-26 ENCOUNTER — Telehealth: Payer: Self-pay

## 2024-07-26 NOTE — Patient Instructions (Signed)
 Desiderio Eans - I am sorry I was unable to reach you today for our scheduled appointment. I work with Crain, Benton CROME, PA and am calling to support your healthcare needs. Please contact me at [phone] at your earliest convenience. I look forward to speaking with you soon.   Thank you,  Tillman Gardener, BSW [place]  [facility], [facility] Social Worker Direct Dial: [phone]  Fax: [phone] Website: [email or web address]

## 2024-07-27 ENCOUNTER — Telehealth: Payer: Self-pay

## 2024-07-28 ENCOUNTER — Telehealth: Payer: Self-pay

## 2024-07-28 ENCOUNTER — Other Ambulatory Visit (HOSPITAL_COMMUNITY): Payer: Self-pay

## 2024-07-28 NOTE — Telephone Encounter (Signed)
 Pharmacy Patient Advocate Encounter   Received notification from CoverMyMeds that prior authorization for Freestyle Libre 2 Plus sensors is required/requested.   Insurance verification completed.   The patient is insured through [facility].   Per test claim: PA required; PA submitted to above mentioned insurance via Latent Key/confirmation #/EOC [id number] Status is pending

## 2024-07-29 ENCOUNTER — Other Ambulatory Visit: Payer: Self-pay

## 2024-07-29 NOTE — Patient Instructions (Signed)

## 2024-07-29 NOTE — Patient Outreach (Signed)
 Complex Care Management   Visit Note  07/29/2024  Name:  Danny Montoya MRN: [id number] DOB: [birth year]  Situation: Referral received for Complex Care Management related to SDOH Barriers:  Transportation I obtained verbal consent from Patient.  Visit completed with Patient  on the phone  Background:   Past Medical History:  Diagnosis Date   Arthritis    Diabetes mellitus (HCC)    Gastroesophageal reflux disease    Heart murmur 2021   Hyperlipidemia    Hypertension    Obstructive sleep apnea    Sleep apnea 10 years    Assessment:  Patient reports he contacted the [facility] and was provided another number for assistance. Patient did not follow up with Support Systems and requested the contact number again. Patient agreed to follow up and declines a follow up visit.   SDOH Interventions    Flowsheet Row Patient Outreach Telephone from 07/19/2024 in [place] [facility]PARTMENT Office Visit from 07/28/2023 in [facility] Medicine at [facility] Office Visit from 05/28/2023 in [facility] at [facility]  SDOH Interventions     Food Insecurity Interventions Intervention Not Indicated Intervention Not Indicated --  Housing Interventions Intervention Not Indicated Intervention Not Indicated --  Transportation Interventions Intervention Not Indicated  [Drives to apppointment, but referred to [facility] [place] for medical transportation after surgery] Intervention Not Indicated --  Utilities Interventions Intervention Not Indicated Intervention Not Indicated --  Alcohol Usage Interventions -- Intervention Not Indicated (Score <7) --  Depression Interventions/Treatment  -- Currently on Treatment Medication  Financial Strain Interventions Intervention Not Indicated Intervention Not Indicated --  Physical Activity Interventions -- Intervention Not Indicated --  Stress Interventions -- Intervention Not  Indicated --  Social Connections Interventions -- Patient Declined --  Health Literacy Interventions -- Intervention Not Indicated --      Recommendation:   None  Follow Up Plan:   Patient has met all care management goals. Care Management case will be closed. Patient has been provided contact information should new needs arise.   Tillman Gardener, BSW [place]  [facility], [facility] Social Worker Direct Dial: [phone]  Fax: [phone] Website: [email or web address]

## 2024-08-01 ENCOUNTER — Other Ambulatory Visit (HOSPITAL_COMMUNITY): Payer: Self-pay

## 2024-08-01 NOTE — Telephone Encounter (Signed)
 Pharmacy Patient Advocate Encounter  Received notification from [employer] that Prior Authorization for Freestyle Libre 2 Plus sensors has been APPROVED from 07/30/24 to 10/05/25. Spoke to pharmacy to process.Copay is $0.    PA #/Case ID/Reference #: [id number]

## 2024-08-04 ENCOUNTER — Other Ambulatory Visit (HOSPITAL_BASED_OUTPATIENT_CLINIC_OR_DEPARTMENT_OTHER): Payer: Self-pay

## 2024-08-04 DIAGNOSIS — I10 Essential (primary) hypertension: Secondary | ICD-10-CM

## 2024-08-04 MED ORDER — LISINOPRIL 40 MG PO TABS: 40.0000 mg | ORAL_TABLET | Freq: Every day | ORAL | 2 refills | Status: AC

## 2024-08-09 ENCOUNTER — Other Ambulatory Visit: Payer: Self-pay | Admitting: Urgent Care

## 2024-08-09 DIAGNOSIS — M170 Bilateral primary osteoarthritis of knee: Secondary | ICD-10-CM

## 2024-08-11 ENCOUNTER — Encounter: Payer: Self-pay | Admitting: *Deleted

## 2024-08-18 ENCOUNTER — Other Ambulatory Visit: Payer: Self-pay | Admitting: Urgent Care

## 2024-08-18 DIAGNOSIS — E785 Hyperlipidemia, unspecified: Secondary | ICD-10-CM

## 2024-08-19 ENCOUNTER — Ambulatory Visit: Admitting: Podiatry

## 2024-08-19 ENCOUNTER — Encounter: Payer: Self-pay | Admitting: Podiatry

## 2024-08-19 DIAGNOSIS — B351 Tinea unguium: Secondary | ICD-10-CM | POA: Diagnosis not present

## 2024-08-19 DIAGNOSIS — M79674 Pain in right toe(s): Secondary | ICD-10-CM | POA: Diagnosis not present

## 2024-08-19 DIAGNOSIS — M79675 Pain in left toe(s): Secondary | ICD-10-CM

## 2024-08-19 DIAGNOSIS — E1142 Type 2 diabetes mellitus with diabetic polyneuropathy: Secondary | ICD-10-CM

## 2024-08-19 NOTE — Progress Notes (Signed)
  Subjective:  Patient ID: Danny Montoya, male    DOB: 1951/01/14,   MRN: [id number]  Chief Complaint  Patient presents with   Diabetes    She's been cutting my nails.  I been complaining about my ingrown toenails but she said my sugar was too high. Saw [place], [place]  - 07/07/2024; A1c - 7.7       73 y.o. male presents for concern of thickened elongated and painful nails that are difficult to trim. Requesting to have them trimmed today. Relates burning and tingling in their feet. Patient is diabetic and last A1c was  Lab Results  Component Value Date   HGBA1C 7.7 (H) 07/07/2024   .   PCP:  Lowella Benton CROME, PA    . Denies any other pedal complaints. Denies n/v/f/c.   Past Medical History:  Diagnosis Date   Arthritis    Diabetes mellitus (HCC)    Gastroesophageal reflux disease    Heart murmur 2021   Hyperlipidemia    Hypertension    Obstructive sleep apnea    Sleep apnea 10 years    Objective:  Physical Exam: Vascular: DP/PT pulses 2/4 bilateral. CFT <3 seconds. Absent hair growth on digits. Edema noted to bilateral lower extremities. Xerosis noted bilaterally.  Skin. No lacerations or abrasions bilateral feet. Nails 1-5 bilateral  are thickened discolored and elongated with subungual debris.  Musculoskeletal: MMT 5/5 bilateral lower extremities in DF, PF, Inversion and Eversion. Deceased ROM in DF of ankle joint.  Neurological: Sensation intact to light touch. Protective sensation diminished bilateral.    Assessment:   1. Pain due to onychomycosis of toenails of both feet   2. Type 2 diabetes mellitus with peripheral neuropathy (HCC)       Plan:  Patient was evaluated and treated and all questions answered. -Discussed and educated patient on diabetic foot care, especially with  regards to the vascular, neurological and musculoskeletal systems.  -Stressed the importance of good glycemic control and the detriment of not  controlling glucose levels in relation to the  foot. -Discussed supportive shoes at all times and checking feet regularly.  -Mechanically debrided all nails 1-5 bilateral using sterile nail nipper and filed with dremel without incident  -Answered all patient questions -Patient to return  in 3 months for at risk foot care -Patient advised to call the office if any problems or questions arise in the meantime.   Asberry Failing, DPM

## 2024-08-22 NOTE — Progress Notes (Unsigned)
 08/23/2024  Patient ID: Danny Montoya, male   DOB: 12/21/1950, 73 y.o.   MRN: [id number]   Subjective/Objective Telephone visit to follow-up on management of chronic disease states   Diabetes Management Plan -Current medications:  Novolog 30-32 units TID with meals based on BG, Tresiba 40 units daily, Ozempic  2mg  weekly -Metformin  recently stopped due to declined eGFR -Receives Tresiba, Novolog, pen needles, and Ozempic  from Novo PAP- patient currently has approximately a 6 month supply of Ozempic  2mg  on hand -Using [place] 2 for CGM-data from last 2 weeks: CGM active 74% Average Glucose 138 GMI 6.6% Target Range 77%, High 19%, Very High 1%, Low 3%, Very Low 0% -A1c 7.7% recently, slightly down from 8% previously -Patient recently started taking Tresiba at bedtime versus in the morning, and he has increased to 40 units daily; this seems to be improving BG while also preventing hypoglycemia -Patient does not endorse any s/sx of hypoglycemia or hyperglycemia -ACEi/ARB for cardiorenal protection:  lisinopril  40mg  daily -UACR of 6 on 10/2 -Statin for ASCVD risk reduction:  rosuvastatin 20mg  daily just recently started- most recent LDL of 118  Blood Pressure Control -Current medications:  lisinopril  40mg  daily, amlodipine  10mg  daily, chlorthalidone  50mg  daily, prazosin  1mg  at bedtime, furosemide  20mg  daily  -Furosemide  20mg  was discontinued a while back due to decline in eGFR, but this medication was on autofill at patient's mail order pharmacy.  Medication was filled in August and again in November for 90 day supplies, and patient has been taking this regularly.  Spironolactone  was stopped at the same time as furosemide , and patient has NOT been taking spironolactone . -Patient does not monitor home BP, but at last OV on 10/2 was 125/70 -Does not endorse any s/sx of hypotension or hypertension  Hyperlipidemia -Current medications:  fenofibrate  160mg  daily, ezetimibe  10mg  daily, rosuvastatin 20mg   daily -Patient started rosuvastatin approximately 5-6 weeks ago  Pain -Current medications:  tranadol 50mg  q8h PRN -This medication was prescribed by PCP in October, and patient is taking occasionally but states it just makes him sleepy and does not relive pain  Assessment/Plan   Diabetes Management Plan -Improved control -BP and UACR at goal -LDL not at goal but expect improvement with rosuvastatin 20mg  daily on board -Continue current regimen at this time -Continue Libre 2 for CGM -Informed patient that Ozempic  will no longer be available through Novo PAP starting in January.  Patient does not qualify for LIS Medicare Extra Help based on HHI, and he does not wish to change Medicare plans for 2026.  Will check on coverage for Ozempic  after the beginning of the year to see if this will be affordable, or if alternatives will need to be considered (he has enough Ozempic  to last until May) -Patient will still be eligible to receive Tresiba, Novolog and pen needles through Novo PAP, and I will coordinate with the medication assistance team to work on a 2026 re-enrollment application. -Patient sees PCP again 2/2 and will be due for an A1c  Blood Pressure Control -Controlled -Stop taking furosemide  20mg  daily -Continue isinopril 40mg  daily, amlodipine  10mg  daily, chlorthalidone  50mg  daily, prazosin  1mg  at bedtime -Patient states he has a home BP monitor, so I advised to begin checking home BP at least a couple days a week -Monitor for any fluid retention in absence of furosemide   Hyperlipidemia -Continue current regimen -Follow-up lipid panel and CMP recommended at next PCP visit -Patient would like to decrease pill burden, so it is possible with statin back on board we  could eliminate ezetimibe  or fenofibrate  if LDL comes back below 70 in the future  Pain -Patient is requesting pain medication other than tramadol , since this makes him sleepy but does not relieve pain- consulting PCP, but  advised patient she will likely want to see him especially if changing pain medication   Follow-up:  1 month to follow-up on BG and BP   [name], PharmD, DPLA

## 2024-08-23 ENCOUNTER — Telehealth: Payer: Self-pay

## 2024-08-23 ENCOUNTER — Other Ambulatory Visit: Payer: Self-pay

## 2024-08-23 DIAGNOSIS — Z794 Long term (current) use of insulin: Secondary | ICD-10-CM

## 2024-08-23 DIAGNOSIS — I10 Essential (primary) hypertension: Secondary | ICD-10-CM

## 2024-08-23 DIAGNOSIS — Z6841 Body Mass Index (BMI) 40.0 and over, adult: Secondary | ICD-10-CM

## 2024-08-23 NOTE — Telephone Encounter (Signed)
 PAP: Patient assistance application for Novolog  and Tresiba  through [employer] has been mailed to pt's home address on file. Provider portion of application will be faxed to provider's office. Patient portion e-filed.

## 2024-08-24 ENCOUNTER — Telehealth: Payer: Self-pay | Admitting: *Deleted

## 2024-08-24 NOTE — Telephone Encounter (Signed)
 Received provider portion PAP [employer] application for Tresiba & Novolog

## 2024-08-24 NOTE — Progress Notes (Signed)
 Complex Care Management Care Guide Note  08/24/2024 Name: Danny Montoya MRN: [id number] DOB: 1951-06-28  Danny Montoya is a 73 y.o. year old male who is a primary care patient of Crain, Whitney L, [place] and is actively engaged with the care management team. I reached out to Desiderio Eans by phone today to assist with re-scheduling  with the RN Case Manager.  Follow up plan: Unsuccessful telephone outreach attempt made. A HIPAA compliant phone message was left for the patient providing contact information and requesting a return call.  Thedford Franks, CMA [place]  [facility], [facility] Guide Direct Dial: [phone]  Fax: [phone] Website: [email or web address]

## 2024-08-24 NOTE — Telephone Encounter (Signed)
 PAP: Application for Novolog  and Tresiba  has been submitted to [employer], via fax

## 2024-08-26 DIAGNOSIS — E7800 Pure hypercholesterolemia, unspecified: Secondary | ICD-10-CM

## 2024-08-26 DIAGNOSIS — Z5982 Transportation insecurity: Secondary | ICD-10-CM

## 2024-08-26 DIAGNOSIS — M170 Bilateral primary osteoarthritis of knee: Secondary | ICD-10-CM

## 2024-08-26 MED ORDER — HYDROCODONE-ACETAMINOPHEN 5-325 MG PO TABS: 1.0000 | ORAL_TABLET | Freq: Three times a day (TID) | ORAL | 0 refills | Status: AC | PRN

## 2024-08-26 NOTE — Addendum Note (Signed)
 Addended by: Flonnie Wierman on: 08/26/2024 08:04 AM   Modules accepted: Orders

## 2024-08-27 NOTE — Addendum Note (Signed)
 Addended by: Jilliam Bellmore on: 08/27/2024 05:02 AM   Modules accepted: Orders

## 2024-08-29 MED ORDER — EZETIMIBE 10 MG PO TABS
10.0000 mg | ORAL_TABLET | Freq: Every day | ORAL | 2 refills | Status: AC
Start: 2024-08-29 — End: ?

## 2024-08-29 NOTE — Telephone Encounter (Signed)
 PAP: Patient assistance application for Novolog and Tresiba has been approved by PAP Companies: [employer] from 10/06/2024 to 10/05/2025. Medication should be delivered to PAP Delivery: Home. For further shipping updates, please contact [employer] at [phone]. Patient ID is: [id number]

## 2024-08-29 NOTE — Addendum Note (Signed)
 Addended by: Justinian Miano on: 08/29/2024 09:23 AM   Modules accepted: Orders

## 2024-08-30 NOTE — Progress Notes (Signed)
 Other Atien[place]                                          MRN: [id number]   08/30/2024   The VBCI Quality Team Specialist reviewed this patient medical record for the purposes of chart review for care gap closure. The following were reviewed: abstraction for care gap closure-glycemic status assessment.    VBCI Quality Team

## 2024-08-31 ENCOUNTER — Telehealth: Payer: Self-pay

## 2024-08-31 NOTE — Progress Notes (Signed)
   Telephone encounter was:  Unsuccessful.  08/31/2024 Name: Quaron Delacruz MRN: [id number] DOB: 1950-12-17  Unsuccessful outbound call made today to assist with:  Transportation Needs   Outreach Attempt:  1st Attempt  A HIPAA compliant voice message was left requesting a return call.  Instructed patient to call back    Jon Colt [facility] Guide, Phone: [phone] Fax: [phone] Website: [email or web address]

## 2024-09-08 ENCOUNTER — Telehealth: Payer: Self-pay

## 2024-09-08 ENCOUNTER — Encounter: Payer: Self-pay | Admitting: Urgent Care

## 2024-09-08 NOTE — Progress Notes (Signed)
   Telephone encounter was:  Unsuccessful.  09/08/2024 Name: Danny Montoya MRN: [id number] DOB: 12-07-1950  Unsuccessful outbound call made today to assist with:  Transportation Needs   Outreach Attempt:  2nd Attempt  No answer and unable to leave a message    Jon Colt [facility]  [facility] Guide, Phone: [phone] Fax: [phone] Website: [email or web address]

## 2024-09-09 ENCOUNTER — Other Ambulatory Visit: Payer: Self-pay

## 2024-09-09 DIAGNOSIS — E785 Hyperlipidemia, unspecified: Secondary | ICD-10-CM

## 2024-09-09 MED ORDER — ROSUVASTATIN CALCIUM 20 MG PO TABS: 20.0000 mg | ORAL_TABLET | Freq: Every day | ORAL | 2 refills | Status: AC

## 2024-09-13 ENCOUNTER — Telehealth: Payer: Self-pay

## 2024-09-13 NOTE — Progress Notes (Signed)
   Telephone encounter was:  Unsuccessful.  09/13/2024 Name: Richy Spradley MRN: [id number] DOB: August 15, 1951  Unsuccessful outbound call made today to assist with:  Transportation Needs   Outreach Attempt:  3rd Attempt.  Referral closed unable to contact patient.  No answer and unable to leave a message    Jon Colt [facility] Guide, Phone: [phone] Fax: [phone] Website: [email or web address]

## 2024-09-19 ENCOUNTER — Encounter (HOSPITAL_BASED_OUTPATIENT_CLINIC_OR_DEPARTMENT_OTHER): Payer: Self-pay

## 2024-09-22 ENCOUNTER — Other Ambulatory Visit

## 2024-09-22 DIAGNOSIS — Z794 Long term (current) use of insulin: Secondary | ICD-10-CM

## 2024-09-22 DIAGNOSIS — E1165 Type 2 diabetes mellitus with hyperglycemia: Secondary | ICD-10-CM

## 2024-09-22 DIAGNOSIS — I10 Essential (primary) hypertension: Secondary | ICD-10-CM

## 2024-09-22 NOTE — Progress Notes (Signed)
 09/22/24  Patient ID: Danny Montoya, male   DOB: 1950/11/14, 73 y.o.   MRN: [id number]   Subjective/Objective Telephone visit to follow-up on management of chronic disease states   Diabetes Management Plan -Current medications:  Novolog 30-32 units TID with meals based on BG, Tresiba 40 units daily, Ozempic  2mg  weekly -Metformin  recently stopped due to declined eGFR -Receives Tresiba, Novolog, pen needles, and Ozempic  from [facility]- patient currently has approximately a 4 month supply of Ozempic  2mg  on hand.  He has been approved to continue to receive Tresiba, Novolog, and pen needles through [facility] for 2026. -Using Libre 2 for CGM-data from last 2 weeks: CGM active 76% Average Glucose 120 GMI 6.2% Target Range 86%, High 7%, Very High 0%, Low 6%, Very Low 1% -Patient recently started taking Tresiba at bedtime versus in the morning, and he has increased to 40 units daily; this seems to be improving BG while also preventing hypoglycemia -There have been some readings in the 60's overnight per CGM, but he does not endorse s/sx of hypoglycemia and this could be due to applied pressure on sensor while sleeping -Patient was seen by Ortho for knee pain recently and is in need of knee replacement(s), but they will no operate until A1c <7% -Discussed possibly changing Ozempic  to Mounjaro  to additional A1c lowering and weight loss benefit, but test claim reflects a 1 month supply would be almost $200 -ACEi/ARB for cardiorenal protection:  lisinopril  40mg  daily -UACR of 6 on 10/2 -Statin for ASCVD risk reduction:  rosuvastatin  20mg  daily just recently started  Blood Pressure Control -Current medications:  lisinopril  40mg  daily, amlodipine  10mg  daily, chlorthalidone  25mg  daily, prazosin  1mg  at bedtime -Patient does not monitor home BP, but at last OV on 10/2 was 125/70 -Does not endorse any s/sx of hypotension or hypertension -Chlorthalidone  prescribed to take two 25mg  tablets daily, but he is only  taking 1 tablet daily  Hyperlipidemia -Current medications:  fenofibrate  160mg  daily, ezetimibe  10mg  daily, rosuvastatin  20mg  daily -Patient started rosuvastatin  approximately 10 weeks ago  Medication Adherence -Patient inquiring about packaging of medications to assist with adherence -Advised him that [facility] offers free pill packaging and home delivery, but often [facility] plans require that maintenance meds be filled through their home delivery pharmacy where he is currently filling his medications  Lab Results  Component Value Date   HGBA1C 7.7 (H) 07/07/2024   HGBA1C 8.0 (H) 03/23/2024   HGBA1C 9.0 (H) 11/06/2023      Component Value Date/Time   NA 138 07/07/2024 1157   K 4.7 07/07/2024 1157   CL 100 07/07/2024 1157   CO2 23 07/07/2024 1157   GLUCOSE 162 (H) 07/07/2024 1157   GLUCOSE 220 (H) 06/23/2023 0908   BUN 25 07/07/2024 1157   CREATININE 1.67 (H) 07/07/2024 1157   CREATININE 1.76 (H) 03/18/2023 1435   CALCIUM  10.1 07/07/2024 1157   PROT 6.9 07/07/2024 1157   ALBUMIN 4.3 07/07/2024 1157   AST 14 07/07/2024 1157   ALT 13 07/07/2024 1157   ALKPHOS 45 (L) 07/07/2024 1157   BILITOT 0.4 07/07/2024 1157   EGFR 43 (L) 07/07/2024 1157   GFRNONAA 40 (L) 06/23/2023 0908      Component Value Date/Time   CHOL 178 07/07/2024 1157   TRIG 116 07/07/2024 1157   HDL 39 (L) 07/07/2024 1157   CHOLHDL 4.6 07/07/2024 1157   CHOLHDL 4.6 09/11/2022 1053   LDLCALC 118 (H) 07/07/2024 1157   LDLCALC 122 (H) 09/11/2022 1053  LABVLDL 21 07/07/2024 1157    Assessment/Plan   Diabetes Management Plan -Improved control based on CGM data; A1c goal <7%- we need to meet this, so patient can get knee replacement surgery -BP and UACR at goal -LDL not at goal but expect improvement with rosuvastatin  20mg  daily on board -Continue current regimen at this time -Continue Libre 2 for CGM- consider changing to [place] 3+ in the future, so patient does not have to scan sensor to get  readings -Patient has enough Ozempic  2mg  to last till around the middle or end of March- I will check test claims again for this and Mounjaro  closer to this time.  If these will not be affordable, [employer] is now accepting new application for PAP for Trulicity- we could change to this GLP1 -Patient sees PCP again 2/2 and will be due for an A1c  Blood Pressure Control -Controlled -Continue isinopril 40mg  daily, amlodipine  10mg  daily, chlorthalidone  25mg  daily, prazosin  1mg  at bedtime -Patient states he has a home BP monitor, so I advised to begin checking home BP at least a couple days a week -If BP increases to >130/80, consider increasing chlorthalidone  back to 2 tablets daily  Hyperlipidemia -Continue current regimen -Follow-up lipid panel and CMP recommended at next PCP visit -Patient would like to decrease pill burden, so it is possible with statin back on board we could eliminate ezetimibe  or fenofibrate  if LDL comes back below 70 in the future  Medication Adherence  -Patient plans to see if insurance will allow fills of maintenance medications outside of [facility] and will notify me.  If so, I can assist in getting him set up for adherence packaging and home delivery for free from [facility]   Follow-up:  3 months   [name], PharmD, DPLA

## 2024-10-07 ENCOUNTER — Ambulatory Visit: Admitting: Urgent Care

## 2024-10-12 ENCOUNTER — Telehealth: Payer: Self-pay

## 2024-10-12 ENCOUNTER — Other Ambulatory Visit: Payer: Self-pay | Admitting: Cardiology

## 2024-10-12 DIAGNOSIS — I10 Essential (primary) hypertension: Secondary | ICD-10-CM

## 2024-10-12 NOTE — Progress Notes (Signed)
 Pharmacy Quality Measure Review   This patient is appearing on a report for being at risk of failing the adherence measure for diabetes (metformin ) medications this calendar year.   Medication: Metformin  1000mg  tablet Last fill date: 03/15/2024 for a 90 day supply   Metformin  was discontinued in August of 2025 due to declining eGFR. The patient is currently taking Novolog, so he shouldn't be included in the MAD population for this year.    Tresten Pantoja Student-PharmD

## 2024-10-20 NOTE — Telephone Encounter (Signed)
 Patient has been approved for Novo Nordisk Novolog & [place]. Will close encounter.

## 2024-11-07 ENCOUNTER — Encounter: Payer: Self-pay | Admitting: Urgent Care

## 2024-11-07 ENCOUNTER — Ambulatory Visit: Admitting: Urgent Care

## 2024-11-07 DIAGNOSIS — Z6841 Body Mass Index (BMI) 40.0 and over, adult: Secondary | ICD-10-CM

## 2024-11-07 DIAGNOSIS — I10 Essential (primary) hypertension: Secondary | ICD-10-CM

## 2024-11-07 DIAGNOSIS — N1831 Chronic kidney disease, stage 3a: Secondary | ICD-10-CM

## 2024-11-07 DIAGNOSIS — E1142 Type 2 diabetes mellitus with diabetic polyneuropathy: Secondary | ICD-10-CM | POA: Diagnosis not present

## 2024-11-07 DIAGNOSIS — Z794 Long term (current) use of insulin: Secondary | ICD-10-CM | POA: Diagnosis not present

## 2024-11-07 DIAGNOSIS — F321 Major depressive disorder, single episode, moderate: Secondary | ICD-10-CM | POA: Diagnosis not present

## 2024-11-07 DIAGNOSIS — R351 Nocturia: Secondary | ICD-10-CM | POA: Diagnosis not present

## 2024-11-07 DIAGNOSIS — M170 Bilateral primary osteoarthritis of knee: Secondary | ICD-10-CM | POA: Diagnosis not present

## 2024-11-07 DIAGNOSIS — Z7985 Long-term (current) use of injectable non-insulin antidiabetic drugs: Secondary | ICD-10-CM | POA: Diagnosis not present

## 2024-11-07 DIAGNOSIS — E559 Vitamin D deficiency, unspecified: Secondary | ICD-10-CM | POA: Diagnosis not present

## 2024-11-07 DIAGNOSIS — E1165 Type 2 diabetes mellitus with hyperglycemia: Secondary | ICD-10-CM | POA: Diagnosis not present

## 2024-11-07 DIAGNOSIS — E785 Hyperlipidemia, unspecified: Secondary | ICD-10-CM | POA: Insufficient documentation

## 2024-11-07 MED ORDER — TIRZEPATIDE 7.5 MG/0.5ML ~~LOC~~ SOAJ: 7.5000 mg | SUBCUTANEOUS | 6 refills | Status: AC

## 2024-11-07 MED ORDER — OXYBUTYNIN CHLORIDE ER 5 MG PO TB24: 5.0000 mg | ORAL_TABLET | Freq: Every day | ORAL | 3 refills | Status: AC

## 2024-11-07 MED ORDER — NOVOLOG FLEXPEN 100 UNIT/ML ~~LOC~~ SOPN: 30.0000 [IU] | PEN_INJECTOR | Freq: Three times a day (TID) | SUBCUTANEOUS | 5 refills | Status: AC

## 2024-11-07 MED ORDER — TRESIBA FLEXTOUCH 100 UNIT/ML ~~LOC~~ SOPN: 40.0000 [IU] | PEN_INJECTOR | Freq: Every day | SUBCUTANEOUS | 6 refills | Status: AC

## 2024-11-07 NOTE — Assessment & Plan Note (Signed)
 Danny Montoya

## 2024-11-07 NOTE — Assessment & Plan Note (Signed)
 Orders:    Lipid panel

## 2024-11-07 NOTE — Assessment & Plan Note (Signed)
" °  Orders:   oxybutynin  (DITROPAN -XL) 5 MG 24 hr tablet; Take 1 tablet (5 mg total) by mouth at bedtime.   PSA  "

## 2024-11-07 NOTE — Assessment & Plan Note (Signed)
" °  Orders:   insulin degludec  (TRESIBA  FLEXTOUCH) 100 UNIT/ML FlexTouch Pen; Inject 40 Units into the skin at bedtime.   insulin aspart  (NOVOLOG  FLEXPEN) 100 UNIT/ML FlexPen; Inject 30-32 Units into the skin 3 (three) times daily with meals.   tirzepatide  (MOUNJARO ) 7.5 MG/0.5ML Pen; Inject 7.5 mg into the skin once a week.   CBC with Differential/Platelet   Hemoglobin A1c   TSH   Comprehensive metabolic panel with GFR  "

## 2024-11-07 NOTE — Assessment & Plan Note (Signed)
  Orders:   CBC with Differential/Platelet   Comprehensive metabolic panel with GFR

## 2024-11-07 NOTE — Assessment & Plan Note (Signed)
" °  °  Assessment and Plan    Type 2 diabetes mellitus with neuropathy Improved glycemic control with current regimen. Discussed potential switch to Mounjaro  for weight loss and sleep apnea benefits, pending insurance approval. - Continue Tresiba  40 units at night. - Continue Novolog  20 units twice daily. - Continue Ozempic  2 mg for now, switch to mounjaro  if approved - Submit prior authorization for Mounjaro . Pt already completed dietary changes, unable to significantly increase activity level due to neuropathy and knee pain - Ordered A1c test when safe to visit the lab.  Primary osteoarthritis of both knees Chronic knee pain managed with gabapentin  and hydrocodone . Knee replacement indicated, contingent on improved A1c levels. - Continue gabapentin  and hydrocodone  as needed.  Chronic kidney disease, stage 3a Recent improvement in kidney function. Discussed impact of caffeine and alcohol on kidney function and importance of hydration. - Increase water intake to 2-3 liters per day. - Reduce caffeine and alcohol consumption.  Pure hypercholesterolemia Managed with Zetia , fenofibrate , and Crestor . - Continue Zetia  10 mg. - Continue fenofibrate  160 mg. - Continue Crestor  20 mg.  Nocturia Frequent urination at night, likely due to stress incontinence. Current treatment with Flomax  ineffective. Discussed potential switch to bladder antispasmodic and impact of caffeine on urinary frequency. - Discontinued Flomax . - Prescribed oxybutinin nightly - Reduce caffeine intake, especially after 3-4 PM.   Depression Pt on SSRI, lexapro . Tolerating  BMI 41 Weight reduction needed to aid in knee replacement surgery qualifications. Will try to switch ozempic  to mounjaro  for added weight loss  HTN Well managed on current regimen.  Vit D deficiency Recheck levels "

## 2024-11-08 ENCOUNTER — Telehealth: Payer: Self-pay

## 2024-11-08 NOTE — Telephone Encounter (Signed)
 Received  Novolog  flex pen 100 u/ml  - 8 boxes ( 5 pens per box ) form patient assistance   Attempted call to patient- voice mail full so could not leave a vioce mail message

## 2024-11-08 NOTE — Telephone Encounter (Signed)
 Attempted call again to patient. Voicemail box not yet set up. Could not leave a voicemail message.

## 2024-11-08 NOTE — Telephone Encounter (Signed)
 Also received Tresiba  u100 4 boxes ( 5 pens per box ) from patient assistance.  Place in pat asst refrigerator until patient can pick up

## 2024-11-09 NOTE — Telephone Encounter (Signed)
 Attempted call to patient. Left a voice mail message requesting a return call.

## 2024-11-09 NOTE — Telephone Encounter (Signed)
 Patient informed of patient assistance medication being received and he will plan to pick this up at his lab draw in office tomorrow 11/10/2024.

## 2024-11-18 ENCOUNTER — Ambulatory Visit: Admitting: Podiatry

## 2024-12-07 ENCOUNTER — Other Ambulatory Visit
